# Patient Record
Sex: Female | Born: 1955 | Hispanic: No | Smoking: Never smoker
Health system: Southern US, Community
[De-identification: ages and names within clinical notes are randomized; demographics above are authoritative.]

## PROBLEM LIST (undated history)

## (undated) DIAGNOSIS — I10 Essential (primary) hypertension: Secondary | ICD-10-CM

---

## 2018-07-25 ENCOUNTER — Inpatient Hospital Stay (HOSPITAL_COMMUNITY)
Admission: AD | Admit: 2018-07-25 | Discharge: 2018-07-29 | DRG: 418 | Disposition: A | Discharge status: Home / Self Care | Payer: Medicare HMO | Source: Other Acute Inpatient Hospital | Attending: Internal Medicine | Admitting: Internal Medicine

## 2018-07-25 DIAGNOSIS — E785 Hyperlipidemia, unspecified: Secondary | ICD-10-CM | POA: Diagnosis present

## 2018-07-25 DIAGNOSIS — M069 Rheumatoid arthritis, unspecified: Secondary | ICD-10-CM | POA: Diagnosis present

## 2018-07-25 DIAGNOSIS — F32A Depression, unspecified: Secondary | ICD-10-CM | POA: Diagnosis present

## 2018-07-25 DIAGNOSIS — M3320 Polymyositis, organ involvement unspecified: Secondary | ICD-10-CM | POA: Diagnosis present

## 2018-07-25 DIAGNOSIS — I10 Essential (primary) hypertension: Secondary | ICD-10-CM | POA: Diagnosis present

## 2018-07-25 DIAGNOSIS — F419 Anxiety disorder, unspecified: Secondary | ICD-10-CM | POA: Diagnosis present

## 2018-07-25 DIAGNOSIS — K828 Other specified diseases of gallbladder: Secondary | ICD-10-CM | POA: Diagnosis present

## 2018-07-25 DIAGNOSIS — Z6835 Body mass index (BMI) 35.0-35.9, adult: Secondary | ICD-10-CM | POA: Diagnosis not present

## 2018-07-25 DIAGNOSIS — R1011 Right upper quadrant pain: Secondary | ICD-10-CM | POA: Diagnosis present

## 2018-07-25 DIAGNOSIS — E876 Hypokalemia: Secondary | ICD-10-CM | POA: Diagnosis present

## 2018-07-25 DIAGNOSIS — F418 Other specified anxiety disorders: Secondary | ICD-10-CM | POA: Diagnosis present

## 2018-07-25 DIAGNOSIS — K851 Biliary acute pancreatitis without necrosis or infection: Secondary | ICD-10-CM | POA: Diagnosis present

## 2018-07-25 DIAGNOSIS — K219 Gastro-esophageal reflux disease without esophagitis: Secondary | ICD-10-CM | POA: Diagnosis present

## 2018-07-25 DIAGNOSIS — K810 Acute cholecystitis: Secondary | ICD-10-CM | POA: Diagnosis present

## 2018-07-25 DIAGNOSIS — K8050 Calculus of bile duct without cholangitis or cholecystitis without obstruction: Secondary | ICD-10-CM

## 2018-07-25 DIAGNOSIS — F329 Major depressive disorder, single episode, unspecified: Secondary | ICD-10-CM | POA: Diagnosis present

## 2018-07-25 DIAGNOSIS — Z7952 Long term (current) use of systemic steroids: Secondary | ICD-10-CM

## 2018-07-25 DIAGNOSIS — K8062 Calculus of gallbladder and bile duct with acute cholecystitis without obstruction: Secondary | ICD-10-CM | POA: Diagnosis present

## 2018-07-25 HISTORY — DX: Essential (primary) hypertension: I10

## 2018-07-25 LAB — CREATININE, SERUM
Creatinine, Ser: 0.96 mg/dL (ref 0.44–1.00)
GFR calc non Af Amer: >60 mL/min (ref >60)

## 2018-07-25 LAB — MAGNESIUM: MAGNESIUM: 2.4 mg/dL (ref 1.7–2.4)

## 2018-07-25 MED ORDER — SODIUM CHLORIDE 0.9 % IV SOLN
1.0000 g | Freq: Three times a day (TID) | INTRAVENOUS | Status: DC
  Administered 2018-07-25 – 2018-07-29 (×10): 1 g via INTRAVENOUS
  Filled 2018-07-25 (×12): qty 1

## 2018-07-25 MED ORDER — LACTATED RINGERS IV SOLN
INTRAVENOUS | Status: DC
  Administered 2018-07-25 – 2018-07-27 (×7): via INTRAVENOUS
  Administered 2018-07-28: 1 mL via INTRAVENOUS
  Administered 2018-07-29: 07:00:00 via INTRAVENOUS

## 2018-07-25 MED ORDER — ENOXAPARIN SODIUM 40 MG/0.4ML ~~LOC~~ SOLN
40.0000 mg | SUBCUTANEOUS | Status: DC
  Administered 2018-07-25 – 2018-07-28 (×4): 40 mg via SUBCUTANEOUS
  Filled 2018-07-25 (×4): qty 0.4

## 2018-07-25 MED ORDER — ONDANSETRON HCL 4 MG PO TABS: 4.0000 mg | ORAL_TABLET | Freq: Four times a day (QID) | ORAL | Status: DC | PRN

## 2018-07-25 MED ORDER — ONDANSETRON HCL 4 MG/2ML IJ SOLN
4.0000 mg | Freq: Four times a day (QID) | INTRAMUSCULAR | Status: DC | PRN
  Administered 2018-07-26 – 2018-07-28 (×3): 4 mg via INTRAVENOUS
  Filled 2018-07-25 (×2): qty 2

## 2018-07-25 MED ORDER — HYDROMORPHONE HCL 1 MG/ML IJ SOLN
1.0000 mg | INTRAMUSCULAR | Status: DC | PRN
  Administered 2018-07-26 – 2018-07-28 (×3): 1 mg via INTRAVENOUS
  Filled 2018-07-25 (×3): qty 1

## 2018-07-25 NOTE — H&P (Signed)
History and Physical   Adriana Roberts JAS:[id number] DOB: 07/23/1956 DOA: 07/25/2018  Referring MD/NP/PA: Dr Nicoletta Ba   PCP: No primary care provider on file.   Outpatient Specialists: None   Patient coming from: Transferred from [facility]  Chief Complaint: Abdominal pain  HPI: Adriana Roberts is a 62 y.o. female with medical history significant of hypertension, rheumatoid arthritis, GERD, depression with anxiety who presented to the emergency room at [facility] rocking hand with right upper quadrant abdominal pain nausea and some episode of vomiting.  Patient has been having symptoms for about 3 days.  Denied any fever or chills no diarrhea no melena no hematemesis.  Patient was seen and evaluated and found to have right upper quadrant abdominal tenderness.  Also had abdominal ultrasound showing cholelithiasis with evidence of acute cholecystitis.  MRCP was done showing gallstones. No gallbladder wall thickening or pericholecystic fluid butCholedocholithiasis. Multiple small stones are identified with evidence of early cholecystitis.  LFTs were also elevated with alkaline phosphatase of 236, AST of 376, ALT of 268, total bilirubin of 1.7, amylase 1799, lipase 4499 and H. pylori serology positive.  Patient will require ERCP and there is no gastroenterologist that so transferred here so GI can see patient and do ERCP.  Dr. Evette Cristal has accepted the patient.  Review of Systems: As per HPI otherwise 10 point review of systems negative.    Past medical history:  Hypertension Polymyositis Morbid obesity Hyperlipidemia  Social history:   has no tobacco, alcohol, or IV drug use.  Allergies:  No known drug allergies  No family history on file.   Prior to Admission medications   Not on File    Physical Exam: Vitals:   07/25/18 1716  BP: 131/75  Pulse: 68  Resp: 16  Temp: 98.3 F (36.8 C)  TempSrc: Oral  SpO2: 98%      Constitutional: NAD, calm,  comfortable Vitals:   07/25/18 1716  BP: 131/75  Pulse: 68  Resp: 16  Temp: 98.3 F (36.8 C)  TempSrc: Oral  SpO2: 98%   Eyes: PERRL, lids and conjunctivae normal ENMT: Mucous membranes are moist. Posterior pharynx clear of any exudate or lesions.Normal dentition.  Neck: normal, supple, no masses, no thyromegaly Respiratory: clear to auscultation bilaterally, no wheezing, no crackles. Normal respiratory effort. No accessory muscle use.  Cardiovascular: Regular rate and rhythm, no murmurs / rubs / gallops. No extremity edema. 2+ pedal pulses. No carotid bruits.  Abdomen: RUQ tenderness, no masses palpated. No hepatosplenomegaly. Bowel sounds positive.  Musculoskeletal: no clubbing / cyanosis. No joint deformity upper and lower extremities. Good ROM, no contractures. Normal muscle tone.  Skin: no rashes, lesions, ulcers. No induration Neurologic: CN 2-12 grossly intact. Sensation intact, DTR normal. Strength 5/5 in all 4.  Psychiatric: Normal judgment and insight. Alert and oriented x 3. Normal mood.     Labs on Admission: I have personally reviewed following labs and imaging studies  CBC: No results for input(s): WBC, NEUTROABS, HGB, HCT, MCV, PLT in the last 168 hours. Basic Metabolic Panel: No results for input(s): NA, K, CL, CO2, GLUCOSE, BUN, CREATININE, CALCIUM, MG, PHOS in the last 168 hours. GFR: CrCl cannot be calculated (No successful lab value found.). Liver Function Tests: No results for input(s): AST, ALT, ALKPHOS, BILITOT, PROT, ALBUMIN in the last 168 hours. No results for input(s): LIPASE, AMYLASE in the last 168 hours. No results for input(s): AMMONIA in the last 168 hours. Coagulation Profile: No results for input(s): INR, PROTIME in the last  168 hours. Cardiac Enzymes: No results for input(s): CKTOTAL, CKMB, CKMBINDEX, TROPONINI in the last 168 hours. BNP (last 3 results) No results for input(s): PROBNP in the last 8760 hours. HbA1C: No results for  input(s): HGBA1C in the last 72 hours. CBG: No results for input(s): GLUCAP in the last 168 hours. Lipid Profile: No results for input(s): CHOL, HDL, LDLCALC, TRIG, CHOLHDL, LDLDIRECT in the last 72 hours. Thyroid Function Tests: No results for input(s): TSH, T4TOTAL, FREET4, T3FREE, THYROIDAB in the last 72 hours. Anemia Panel: No results for input(s): VITAMINB12, FOLATE, FERRITIN, TIBC, IRON, RETICCTPCT in the last 72 hours. Urine analysis: No results found for: COLORURINE, APPEARANCEUR, LABSPEC, PHURINE, GLUCOSEU, HGBUR, BILIRUBINUR, KETONESUR, PROTEINUR, UROBILINOGEN, NITRITE, LEUKOCYTESUR Sepsis Labs: @LABRCNTIP (procalcitonin:4,lacticidven:4) )No results found for this or any previous visit (from the past 240 hour(s)).   Radiological Exams on Admission: No results found.   Assessment/Plan Principal Problem:   Acute gallstone pancreatitis Active Problems:   Cholecystitis, acute   Benign essential HTN   Depression     #1 acute gallstone pancreatitis: Patient will be admitted and monitored.  Serial lipase checks.  Bowel rest.  Pain management with nausea management.  IV fluids using Ringer's lactate.  GI consulted and will follow up with the patient.  Patient will also need ERCP and ultimately laparoscopic cholecystectomy.  #2 acute cholecystitis: Patient will definitely need some surgical intervention once the pancreatitis is resolved.  Continue bowel rest with IV antibiotics  #3 hypertension: Continue blood pressure control with home regimen.  #4 polymyositis: Patient is on methotrexate and other medications from home.  Once med rec is completed patient will be initiated on home regimen.  #5 depression with anxiety: patient on Wellbutrin from home which will be resumed as soon as possible.  #6 morbid obesity: Dietary counseling.   DVT prophylaxis: Lovenox Code Status: Full code Family Communication: Sister with the patient Disposition Plan: Home Consults called: Dr.  , gastroenterology Admission status: inpatient   Severity of Illness: The appropriate patient status for this patient is INPATIENT. Inpatient status is judged to be reasonable and necessary in order to provide the required intensity of service to ensure the patient's safety. The patient's presenting symptoms, physical exam findings, and initial radiographic and laboratory data in the context of their chronic comorbidities is felt to place them at high risk for further clinical deterioration. Furthermore, it is not anticipated that the patient will be medically stable for discharge from the hospital within 2 midnights of admission. The following factors support the patient status of inpatient.   " The patient's presenting symptoms include abdominal pain. " The worrisome physical exam findings include right upper quadrant abdominal tenderness. " The initial radiographic and laboratory data are worrisome because of elevated amylase lipase and liver function test with ultrasounds and MRCP findings. " The chronic co-morbidities include hypertension and polymyositis.   * I certify that at the point of admission it is my clinical judgment that the patient will require inpatient hospital care spanning beyond 2 midnights from the point of admission due to high intensity of service, high risk for further deterioration and high frequency of surveillance required.Herbert Moors MD [facility] Pager [phone]  If 7PM-7AM, please contact night-coverage [email or web address] Password [facility]  07/25/2018, 6:24 PM

## 2018-07-25 NOTE — Progress Notes (Signed)
Pharmacy Antibiotic Note  Adriana Roberts is a 62 y.o. female admitted on 07/25/2018 with an intraabdominal infection .  Pharmacy has been consulted for meropenem dosing.  Plan: Meropenem 1gm IV q8h Monitor for deescalation, LOT, and clinical course    Temp (24hrs), Avg:98.5 F (36.9 C), Min:98.3 F (36.8 C), Max:98.6 F (37 C)  Recent Labs  Lab 07/25/18 1839  CREATININE 0.96    CrCl cannot be calculated (Unknown ideal weight.).    No Known Allergies   Adriana Roberts A. Jeanella Craze, PharmD, BCPS Clinical Pharmacist [place] Pager: [phone] Please utilize Amion for appropriate phone number to reach the unit pharmacist [facility])    07/25/2018 10:20 PM

## 2018-07-25 NOTE — Progress Notes (Addendum)
Received patient to 6N. Patient alert and oriented x4. Pt independently ambulated from stretcher to bed. Dr. Nelson Chimes paged upon patient's arrival to 6N. Awaiting orders.   [phone] Paged [facility] for patient orders.

## 2018-07-26 DIAGNOSIS — K810 Acute cholecystitis: Secondary | ICD-10-CM

## 2018-07-26 DIAGNOSIS — K851 Biliary acute pancreatitis without necrosis or infection: Principal | ICD-10-CM

## 2018-07-26 DIAGNOSIS — M3320 Polymyositis, organ involvement unspecified: Secondary | ICD-10-CM

## 2018-07-26 DIAGNOSIS — I10 Essential (primary) hypertension: Secondary | ICD-10-CM

## 2018-07-26 DIAGNOSIS — F329 Major depressive disorder, single episode, unspecified: Secondary | ICD-10-CM

## 2018-07-26 LAB — CBC
HCT: 38.1 % (ref 36.0–46.0)
Hemoglobin: 11.6 g/dL — ABNORMAL LOW (ref 12.0–15.0)
MCH: 26.6 pg (ref 26.0–34.0)
MCHC: 30.4 g/dL (ref 30.0–36.0)
MCV: 87.4 fL (ref 78.0–100.0)
PLATELETS: 293 10*3/uL (ref 150–400)
RBC: 4.36 MIL/uL (ref 3.87–5.11)
RDW: 16.6 % — AB (ref 11.5–15.5)
WBC: 6.2 10*3/uL (ref 4.0–10.5)

## 2018-07-26 LAB — COMPREHENSIVE METABOLIC PANEL
ALT: 201 U/L — AB (ref 0–44)
AST: 180 U/L — AB (ref 15–41)
Albumin: 3.2 g/dL — ABNORMAL LOW (ref 3.5–5.0)
Alkaline Phosphatase: 191 U/L — ABNORMAL HIGH (ref 38–126)
Anion gap: 11 (ref 5–15)
BUN: 15 mg/dL (ref 6–20)
CHLORIDE: 110 mmol/L (ref 98–111)
CO2: 25 mmol/L (ref 22–32)
CREATININE: 0.88 mg/dL (ref 0.44–1.00)
Calcium: 8.8 mg/dL — ABNORMAL LOW (ref 8.9–10.3)
Glucose, Bld: 66 mg/dL — ABNORMAL LOW (ref 70–99)
POTASSIUM: 3.1 mmol/L — AB (ref 3.5–5.1)
Sodium: 146 mmol/L — ABNORMAL HIGH (ref 135–145)
TOTAL PROTEIN: 6.7 g/dL (ref 6.5–8.1)
Total Bilirubin: 1.8 mg/dL — ABNORMAL HIGH (ref 0.3–1.2)

## 2018-07-26 LAB — HIV ANTIBODY (ROUTINE TESTING W REFLEX): HIV SCREEN 4TH GENERATION: NONREACTIVE

## 2018-07-26 MED ORDER — FAMOTIDINE IN NACL 20-0.9 MG/50ML-% IV SOLN
20.0000 mg | INTRAVENOUS | Status: DC
  Administered 2018-07-26 – 2018-07-28 (×3): 20 mg via INTRAVENOUS
  Filled 2018-07-26 (×3): qty 50

## 2018-07-26 MED ORDER — POTASSIUM CHLORIDE 10 MEQ/100ML IV SOLN
10.0000 meq | INTRAVENOUS | Status: AC
  Administered 2018-07-26 (×4): 10 meq via INTRAVENOUS
  Filled 2018-07-26 (×2): qty 100

## 2018-07-26 NOTE — H&P (View-Only) (Signed)
[facility] Consult  Referring Provider: Rometta Emery, MD Primary Care Physician:  No primary care provider on file. Primary Gastroenterologist: Gentry Fitz  Reason for Consultation:  CBD stones, pancreatitis  HPI: Adriana Roberts is a 62 y.o. female was transferred from [facility] she had an MRCP which showed at least 9 CBD stones measuring up to 5 mm, distantly dilated CBD of 1 cm, elevated lipase and amylase compatible with pancreatitis and multiple gallstones. Patient has been symptomatic since August with several episodes of epigastric and upper abdominal pain associated with nausea and bloating and radiation of pain to the back. Lab work from [facility] also showed that she is positive for H. Pylori. Patient denies recent unintentional weight loss, acid reflux or difficulty swallowing.   Past medical history: Hypertension Polymyositis Rheumatoid arthritis Depression Anxiety Obesity   Prior to Admission medications   Not on File  Prednisone methotrexate  Current Facility-Administered Medications  Medication Dose Route Frequency Provider Last Rate Last Dose  . enoxaparin (LOVENOX) injection 40 mg  40 mg Subcutaneous Q24H Earlie Lou L, MD   40 mg at 07/25/18 1913  . HYDROmorphone (DILAUDID) injection 1 mg  1 mg Intravenous Q3H PRN Rometta Emery, MD   1 mg at 07/26/18 0747  . lactated ringers infusion   Intravenous Continuous Rometta Emery, MD 125 mL/hr at 07/26/18 0500    . meropenem (MERREM) 1 g in sodium chloride 0.9 % 100 mL IVPB  1 g Intravenous Q8H Pierce, Dwayne A, RPH 200 mL/hr at 07/26/18 0509 1 g at 07/26/18 0509  . ondansetron (ZOFRAN) tablet 4 mg  4 mg Oral Q6H PRN Rometta Emery, MD       Or  . ondansetron (ZOFRAN) injection 4 mg  4 mg Intravenous Q6H PRN Rometta Emery, MD        Allergies as of 07/25/2018  . (No Known Allergies)    No family history on file.  Social History   Socioeconomic History  . Marital  status: Unknown    Spouse name: Not on file  . Number of children: Not on file  . Years of education: Not on file  . Highest education level: Not on file  Occupational History  . Not on file  Social Needs  . Financial resource strain: Not on file  . Food insecurity:    Worry: Not on file    Inability: Not on file  . Transportation needs:    Medical: Not on file    Non-medical: Not on file  Tobacco Use  . Smoking status: Not on file  Substance and Sexual Activity  . Alcohol use: Not on file  . Drug use: Not on file  . Sexual activity: Not on file  Lifestyle  . Physical activity:    Days per week: Not on file    Minutes per session: Not on file  . Stress: Not on file  Relationships  . Social connections:    Talks on phone: Not on file    Gets together: Not on file    Attends religious service: Not on file    Active member of club or organization: Not on file    Attends meetings of clubs or organizations: Not on file    Relationship status: Not on file  . Intimate partner violence:    Fear of current or ex partner: Not on file    Emotionally abused: Not on file    Physically abused: Not on file    Forced  sexual activity: Not on file  Other Topics Concern  . Not on file  Social History Narrative  . Not on file    Review of Systems:  GI: Described in detail in HPI.    Gen: Denies any fever, chills, rigors, night sweats, anorexia, fatigue, weakness, malaise, involuntary weight loss, and sleep disorder CV: Denies chest pain, angina, palpitations, syncope, orthopnea, PND, peripheral edema, and claudication. Resp: Denies dyspnea, cough, sputum, wheezing, coughing up blood. GU : Denies urinary burning, blood in urine, urinary frequency, urinary hesitancy, nocturnal urination, and urinary incontinence. MS: Denies joint pain or swelling.  Denies muscle weakness, cramps, atrophy.  Derm: Denies rash, itching, oral ulcerations, hives, unhealing ulcers.  Psych: Denies depression,  anxiety, memory loss, suicidal ideation, hallucinations,  and confusion. Heme: Denies bruising, bleeding, and enlarged lymph nodes. Neuro:  Denies any headaches, dizziness, paresthesias. Endo:  Denies any problems with DM, thyroid, adrenal function.  Physical Exam: Vital signs in last 24 hours: Temp:  [98 F (36.7 C)-98.6 F (37 C)] 98 F (36.7 C) (09/28 0508) Pulse Rate:  [65-75] 75 (09/28 0508) Resp:  [16-20] 20 (09/28 0508) BP: (102-131)/(66-75) 125/71 (09/28 0508) SpO2:  [98 %-100 %] 100 % (09/28 0508) Last BM Date: 07/24/18  General:   Alert,  Well-developed, obese, pleasant and cooperative in NAD Head:  Normocephalic and atraumatic. Eyes:  Sclera clear, no icterus.   Conjunctiva pink. Ears:  Normal auditory acuity. Nose:  No deformity, discharge,  or lesions. Mouth:  No deformity or lesions.  Oropharynx pink & moist. Neck:  Supple; no masses or thyromegaly. Lungs:  Clear throughout to auscultation.   No wheezes, crackles, or rhonchi. No acute distress. Heart:  Regular rate and rhythm; no murmurs, clicks, rubs,  or gallops. Extremities:  Without clubbing or edema. Neurologic:  Alert and  oriented x4;  grossly normal neurologically. Skin:  Intact without significant lesions or rashes. Psych:  Alert and cooperative. Normal mood and affect. Abdomen:  Soft, Upper abdominal tenderness and nondistended. No masses, hepatosplenomegaly or hernias noted. Normal bowel sounds, without guarding, and without rebound.         Lab Results: Recent Labs    07/26/18 0545  WBC 6.2  HGB 11.6*  HCT 38.1  PLT 293   BMET Recent Labs    07/25/18 1839 07/26/18 0545  NA  --  146*  K  --  3.1*  CL  --  110  CO2  --  25  GLUCOSE  --  66*  BUN  --  15  CREATININE 0.96 0.88  CALCIUM  --  8.8*   LFT Recent Labs    07/26/18 0545  PROT 6.7  ALBUMIN 3.2*  AST 180*  ALT 201*  ALKPHOS 191*  BILITOT 1.8*   PT/INR No results for input(s): LABPROT, INR in the last 72  hours.  Studies/Results: No results found.   Labs from [facility] show elevated lipase of 4000. Elevated liver enzymes compatible with choledocholithiasis  Impression: 1. Gallstone pancreatitis-T bili 1.8/AST 180/AST 201/ALP 191 2. Choledocholithiasis   Plan: Clear liquid diet, nothing by mouth post midnight, ERCP in a.m. depending on anesthesia availability. Recommend Surgical consultation for evaluation for cholecystectomy. Continuing Ringer's lactate at 125 mL per hour. The risks and benefits of the procedure were discussed with the patient in details. She understands and verbalizes consent.   LOS: 1 day   Rahma Meller, M.D.  07/26/2018, 8:44 AM  Pager [phone] If no answer or after 5 PM call [phone] 

## 2018-07-26 NOTE — Progress Notes (Addendum)
PROGRESS NOTE    Adriana Roberts  WEX:[id number] DOB: [birth year] DOA: 07/25/2018 PCP: No primary care provider on file.    Brief Narrative:  62 year old female who presented with abdominal pain.  She does have significant past medical history for hypertension, rheumatoid arthritis, GERD, depression and anxiety.  Reported 3-day history of right upper quadrant abdominal pain, associated with nausea and vomiting.  Patient was evaluated at [facility], and diagnosed with acute cholecystitis.  Her liver enzymes were elevated, alkaline phosphatase 236, AST 376, ALT 268, amylase 1799, lipase 4499.  Patient was transferred for ERCP. on her initial physical examination blood pressure 131/75, heart rate 68, respiratory rate 16, temperature 98.3, oxygen saturation 98%.  Lungs were clear to auscultation bilaterally, heart S1-S2 present and rhythmic, abdomen tender at the right upper quadrant, no lower extremity edema.  Sodium 146, potassium 3.1, chloride 110, bicarb 25, glucose 66, BUN 15, creatinine 0.88, alkaline phosphatase 191, AST 180, ALT 201, total bilirubin is 1.8, white count 6.2, hemoglobin 11.6, hematocrit 38.1, platelets 293.  Patient was admitted to the hospital with working diagnosis of acute cholecystitis/ choledocholithiasis complicated by gallstone pancreatitis..   Assessment & Plan:   Principal Problem:   Acute gallstone pancreatitis Active Problems:   Cholecystitis, acute   Benign essential HTN   Depression   Polymyositis (HCC)   1.  Acute cholecystitis, complicated by choledocholithiasis and gallstone pancreatitis. Continue supportive medical therapy with IV fluids (balanced electrolyte solutions), IV antiacids, as needed IV analgesics and IV antiemetics. Prophylactic antibiotic with IV meropenem. Will follow with GI recommendations for ERCP in am, patient will need cholecystectomy on this admission. Follow LFTs in am.   2.  Hypertension. Blood pressure  systolic has remained stable 125 to 135 mmHg. Will continue close monitoring, patient is npo. At home treated with carvedilol, amlodipine, hctz.   3. Rheumatoid arthritis. Patient on methotrexate and prednisone 5 mg daily. Will have a close follow up of blood pressure and electrolytes, if signs of adrenal insufficiency will add stress dose steroids.    4. Hypokalemia. K correction with Kcl, 40 meq IV, will continue lactate ringers at 100 ml per hour (follow a restrictive IV fluids strategy). Follow on renal function and electrolytes in am. Today serum cr at 0,88 with serum bicarbonate at 25.    DVT prophylaxis: enoxaparin   Code Status:  full Family Communication: no family at the bedside  Disposition Plan/ discharge barriers: pending GI and surgical interventions   Consultants:    GI   Surgery   Procedures:     Antimicrobials:   Meropenem IV     Subjective: Patient continue to have abdominal pain with nausea, moderate in intensity, no chest pain or dyspnea.   Objective: Vitals:   07/25/18 1716 07/25/18 2024 07/26/18 0508  BP: 131/75 102/66 125/71  Pulse: 68 65 75  Resp: 16 20 20   Temp: 98.3 F (36.8 C) 98.6 F (37 C) 98 F (36.7 C)  TempSrc: Oral Oral Oral  SpO2: 98% 100% 100%    Intake/Output Summary (Last 24 hours) at 07/26/2018 1226 Last data filed at 07/26/2018 1011 Gross per 24 hour  Intake 1600.14 ml  Output -  Net 1600.14 ml   There were no vitals filed for this visit.  Examination:   General: deconditioned  Neurology: Awake and alert, non focal  E ENT: mild pallor, no icterus, oral mucosa moist Cardiovascular: No JVD. S1-S2 present, rhythmic, no gallops, rubs, or murmurs. No lower extremity edema. Pulmonary: positive breath sounds  bilaterally, adequate air movement, no wheezing, rhonchi or rales. Gastrointestinal. Abdomen distended, tender to deep palpation, no organomegaly, no rebound or guarding Skin. No rashes Musculoskeletal: no joint  deformities     Data Reviewed: I have personally reviewed following labs and imaging studies  CBC: Recent Labs  Lab 07/26/18 0545  WBC 6.2  HGB 11.6*  HCT 38.1  MCV 87.4  PLT 293   Basic Metabolic Panel: Recent Labs  Lab 07/25/18 1839 07/26/18 0545  NA  --  146*  K  --  3.1*  CL  --  110  CO2  --  25  GLUCOSE  --  66*  BUN  --  15  CREATININE 0.96 0.88  CALCIUM  --  8.8*  MG 2.4  --    GFR: CrCl cannot be calculated (Unknown ideal weight.). Liver Function Tests: Recent Labs  Lab 07/26/18 0545  AST 180*  ALT 201*  ALKPHOS 191*  BILITOT 1.8*  PROT 6.7  ALBUMIN 3.2*   No results for input(s): LIPASE, AMYLASE in the last 168 hours. No results for input(s): AMMONIA in the last 168 hours. Coagulation Profile: No results for input(s): INR, PROTIME in the last 168 hours. Cardiac Enzymes: No results for input(s): CKTOTAL, CKMB, CKMBINDEX, TROPONINI in the last 168 hours. BNP (last 3 results) No results for input(s): PROBNP in the last 8760 hours. HbA1C: No results for input(s): HGBA1C in the last 72 hours. CBG: No results for input(s): GLUCAP in the last 168 hours. Lipid Profile: No results for input(s): CHOL, HDL, LDLCALC, TRIG, CHOLHDL, LDLDIRECT in the last 72 hours. Thyroid Function Tests: No results for input(s): TSH, T4TOTAL, FREET4, T3FREE, THYROIDAB in the last 72 hours. Anemia Panel: No results for input(s): VITAMINB12, FOLATE, FERRITIN, TIBC, IRON, RETICCTPCT in the last 72 hours.    Radiology Studies: I have reviewed all of the imaging during this hospital visit personally     Scheduled Meds: . enoxaparin (LOVENOX) injection  40 mg Subcutaneous Q24H   Continuous Infusions: . lactated ringers 125 mL/hr at 07/26/18 1027  . meropenem (MERREM) IV 1 g (07/26/18 0509)     LOS: 1 day        Consetta Cosner Annett Gula, MD [facility] Pager [phone]

## 2018-07-26 NOTE — Consult Note (Addendum)
[facility] Consult  Referring Provider: Rometta Emery, MD Primary Care Physician:  No primary care provider on file. Primary Gastroenterologist: Gentry Fitz  Reason for Consultation:  CBD stones, pancreatitis  HPI: Adriana Roberts is a 62 y.o. female was transferred from [facility] she had an MRCP which showed at least 9 CBD stones measuring up to 5 mm, distantly dilated CBD of 1 cm, elevated lipase and amylase compatible with pancreatitis and multiple gallstones. Patient has been symptomatic since August with several episodes of epigastric and upper abdominal pain associated with nausea and bloating and radiation of pain to the back. Lab work from [facility] also showed that she is positive for H. Pylori. Patient denies recent unintentional weight loss, acid reflux or difficulty swallowing.   Past medical history: Hypertension Polymyositis Rheumatoid arthritis Depression Anxiety Obesity   Prior to Admission medications   Not on File  Prednisone methotrexate  Current Facility-Administered Medications  Medication Dose Route Frequency Provider Last Rate Last Dose  . enoxaparin (LOVENOX) injection 40 mg  40 mg Subcutaneous Q24H Earlie Lou L, MD   40 mg at 07/25/18 1913  . HYDROmorphone (DILAUDID) injection 1 mg  1 mg Intravenous Q3H PRN Rometta Emery, MD   1 mg at 07/26/18 0747  . lactated ringers infusion   Intravenous Continuous Rometta Emery, MD 125 mL/hr at 07/26/18 0500    . meropenem (MERREM) 1 g in sodium chloride 0.9 % 100 mL IVPB  1 g Intravenous Q8H Pierce, Dwayne A, RPH 200 mL/hr at 07/26/18 0509 1 g at 07/26/18 0509  . ondansetron (ZOFRAN) tablet 4 mg  4 mg Oral Q6H PRN Rometta Emery, MD       Or  . ondansetron (ZOFRAN) injection 4 mg  4 mg Intravenous Q6H PRN Rometta Emery, MD        Allergies as of 07/25/2018  . (No Known Allergies)    No family history on file.  Social History   Socioeconomic History  . Marital  status: Unknown    Spouse name: Not on file  . Number of children: Not on file  . Years of education: Not on file  . Highest education level: Not on file  Occupational History  . Not on file  Social Needs  . Financial resource strain: Not on file  . Food insecurity:    Worry: Not on file    Inability: Not on file  . Transportation needs:    Medical: Not on file    Non-medical: Not on file  Tobacco Use  . Smoking status: Not on file  Substance and Sexual Activity  . Alcohol use: Not on file  . Drug use: Not on file  . Sexual activity: Not on file  Lifestyle  . Physical activity:    Days per week: Not on file    Minutes per session: Not on file  . Stress: Not on file  Relationships  . Social connections:    Talks on phone: Not on file    Gets together: Not on file    Attends religious service: Not on file    Active member of club or organization: Not on file    Attends meetings of clubs or organizations: Not on file    Relationship status: Not on file  . Intimate partner violence:    Fear of current or ex partner: Not on file    Emotionally abused: Not on file    Physically abused: Not on file    Forced  sexual activity: Not on file  Other Topics Concern  . Not on file  Social History Narrative  . Not on file    Review of Systems:  GI: Described in detail in HPI.    Gen: Denies any fever, chills, rigors, night sweats, anorexia, fatigue, weakness, malaise, involuntary weight loss, and sleep disorder CV: Denies chest pain, angina, palpitations, syncope, orthopnea, PND, peripheral edema, and claudication. Resp: Denies dyspnea, cough, sputum, wheezing, coughing up blood. GU : Denies urinary burning, blood in urine, urinary frequency, urinary hesitancy, nocturnal urination, and urinary incontinence. MS: Denies joint pain or swelling.  Denies muscle weakness, cramps, atrophy.  Derm: Denies rash, itching, oral ulcerations, hives, unhealing ulcers.  Psych: Denies depression,  anxiety, memory loss, suicidal ideation, hallucinations,  and confusion. Heme: Denies bruising, bleeding, and enlarged lymph nodes. Neuro:  Denies any headaches, dizziness, paresthesias. Endo:  Denies any problems with DM, thyroid, adrenal function.  Physical Exam: Vital signs in last 24 hours: Temp:  [98 F (36.7 C)-98.6 F (37 C)] 98 F (36.7 C) (09/28 0508) Pulse Rate:  [65-75] 75 (09/28 0508) Resp:  [16-20] 20 (09/28 0508) BP: (102-131)/(66-75) 125/71 (09/28 0508) SpO2:  [98 %-100 %] 100 % (09/28 0508) Last BM Date: 07/24/18  General:   Alert,  Well-developed, obese, pleasant and cooperative in NAD Head:  Normocephalic and atraumatic. Eyes:  Sclera clear, no icterus.   Conjunctiva pink. Ears:  Normal auditory acuity. Nose:  No deformity, discharge,  or lesions. Mouth:  No deformity or lesions.  Oropharynx pink & moist. Neck:  Supple; no masses or thyromegaly. Lungs:  Clear throughout to auscultation.   No wheezes, crackles, or rhonchi. No acute distress. Heart:  Regular rate and rhythm; no murmurs, clicks, rubs,  or gallops. Extremities:  Without clubbing or edema. Neurologic:  Alert and  oriented x4;  grossly normal neurologically. Skin:  Intact without significant lesions or rashes. Psych:  Alert and cooperative. Normal mood and affect. Abdomen:  Soft, Upper abdominal tenderness and nondistended. No masses, hepatosplenomegaly or hernias noted. Normal bowel sounds, without guarding, and without rebound.         Lab Results: Recent Labs    07/26/18 0545  WBC 6.2  HGB 11.6*  HCT 38.1  PLT 293   BMET Recent Labs    07/25/18 1839 07/26/18 0545  NA  --  146*  K  --  3.1*  CL  --  110  CO2  --  25  GLUCOSE  --  66*  BUN  --  15  CREATININE 0.96 0.88  CALCIUM  --  8.8*   LFT Recent Labs    07/26/18 0545  PROT 6.7  ALBUMIN 3.2*  AST 180*  ALT 201*  ALKPHOS 191*  BILITOT 1.8*   PT/INR No results for input(s): LABPROT, INR in the last 72  hours.  Studies/Results: No results found.   Labs from [facility] show elevated lipase of 4000. Elevated liver enzymes compatible with choledocholithiasis  Impression: 1. Gallstone pancreatitis-T bili 1.8/AST 180/AST 201/ALP 191 2. Choledocholithiasis   Plan: Clear liquid diet, nothing by mouth post midnight, ERCP in a.m. depending on anesthesia availability. Recommend Surgical consultation for evaluation for cholecystectomy. Continuing Ringer's lactate at 125 mL per hour. The risks and benefits of the procedure were discussed with the patient in details. She understands and verbalizes consent.   LOS: 1 day   Kerin Salen, M.D.  07/26/2018, 8:44 AM  Pager [phone] If no answer or after 5 PM call [phone]

## 2018-07-26 NOTE — Anesthesia Preprocedure Evaluation (Addendum)
Anesthesia Evaluation  Patient identified by MRN, date of birth, ID band Patient awake    Reviewed: Allergy & Precautions, NPO status , Patient's Chart, lab work & pertinent test results  History of Anesthesia Complications Negative for: history of anesthetic complications  Airway Mallampati: II  TM Distance: >3 FB Neck ROM: Full    Dental no notable dental hx. (+) Dental Advisory Given   Pulmonary neg pulmonary ROS,    Pulmonary exam normal        Cardiovascular hypertension, Pt. on home beta blockers and Pt. on medications Normal cardiovascular exam     Neuro/Psych PSYCHIATRIC DISORDERS Depression    GI/Hepatic Neg liver ROS,   Endo/Other  Morbid obesity  Renal/GU negative Renal ROS     Musculoskeletal negative musculoskeletal ROS (+)   Abdominal   Peds  Hematology negative hematology ROS (+)   Anesthesia Other Findings Day of surgery medications reviewed with the patient.  Reproductive/Obstetrics                            Anesthesia Physical Anesthesia Plan  ASA: III  Anesthesia Plan: General   Post-op Pain Management:    Induction:   PONV Risk Score and Plan: 3 and Ondansetron, Dexamethasone and Scopolamine patch - Pre-op  Airway Management Planned: Oral ETT  Additional Equipment:   Intra-op Plan:   Post-operative Plan: Extubation in OR  Informed Consent: I have reviewed the patients History and Physical, chart, labs and discussed the procedure including the risks, benefits and alternatives for the proposed anesthesia with the patient or authorized representative who has indicated his/her understanding and acceptance.   Dental advisory given  Plan Discussed with: CRNA and Anesthesiologist  Anesthesia Plan Comments:        Anesthesia Quick Evaluation

## 2018-07-27 ENCOUNTER — Encounter (HOSPITAL_COMMUNITY)
Admission: AD | Disposition: A | Discharge status: Home / Self Care | Payer: Self-pay | Source: Other Acute Inpatient Hospital | Attending: Internal Medicine

## 2018-07-27 ENCOUNTER — Inpatient Hospital Stay (HOSPITAL_COMMUNITY): Payer: Medicare HMO | Admitting: Anesthesiology

## 2018-07-27 ENCOUNTER — Inpatient Hospital Stay (HOSPITAL_COMMUNITY): Payer: Medicare HMO

## 2018-07-27 ENCOUNTER — Encounter (HOSPITAL_COMMUNITY): Payer: Self-pay | Admitting: Certified Registered Nurse Anesthetist

## 2018-07-27 HISTORY — PX: SPHINCTEROTOMY: SHX5544

## 2018-07-27 HISTORY — PX: ERCP: SHX5425

## 2018-07-27 HISTORY — PX: REMOVAL OF STONES: SHX5545

## 2018-07-27 LAB — CBC WITH DIFFERENTIAL/PLATELET
Abs Immature Granulocytes: 0 10*3/uL (ref 0.0–0.1)
BASOS PCT: 0 %
Basophils Absolute: 0 10*3/uL (ref 0.0–0.1)
EOS ABS: 0 10*3/uL (ref 0.0–0.7)
EOS PCT: 0 %
HCT: 39.9 % (ref 36.0–46.0)
HEMOGLOBIN: 12.3 g/dL (ref 12.0–15.0)
Immature Granulocytes: 0 %
Lymphocytes Relative: 7 %
Lymphs Abs: 0.8 10*3/uL (ref 0.7–4.0)
MCH: 26.7 pg (ref 26.0–34.0)
MCHC: 30.8 g/dL (ref 30.0–36.0)
MCV: 86.7 fL (ref 78.0–100.0)
Monocytes Absolute: 0.1 10*3/uL (ref 0.1–1.0)
Monocytes Relative: 1 %
NEUTROS PCT: 92 %
Neutro Abs: 9.8 10*3/uL — ABNORMAL HIGH (ref 1.7–7.7)
Platelets: 292 10*3/uL (ref 150–400)
RBC: 4.6 MIL/uL (ref 3.87–5.11)
RDW: 16.1 % — AB (ref 11.5–15.5)
WBC: 10.6 10*3/uL — AB (ref 4.0–10.5)

## 2018-07-27 LAB — COMPREHENSIVE METABOLIC PANEL
ALT: 133 U/L — AB (ref 0–44)
AST: 68 U/L — AB (ref 15–41)
Albumin: 3.3 g/dL — ABNORMAL LOW (ref 3.5–5.0)
Alkaline Phosphatase: 184 U/L — ABNORMAL HIGH (ref 38–126)
Anion gap: 11 (ref 5–15)
BILIRUBIN TOTAL: 0.9 mg/dL (ref 0.3–1.2)
BUN: 5 mg/dL — AB (ref 6–20)
CALCIUM: 8.9 mg/dL (ref 8.9–10.3)
CO2: 26 mmol/L (ref 22–32)
CREATININE: 0.74 mg/dL (ref 0.44–1.00)
Chloride: 103 mmol/L (ref 98–111)
Glucose, Bld: 109 mg/dL — ABNORMAL HIGH (ref 70–99)
Potassium: 3.3 mmol/L — ABNORMAL LOW (ref 3.5–5.1)
Sodium: 140 mmol/L (ref 135–145)
TOTAL PROTEIN: 7 g/dL (ref 6.5–8.1)

## 2018-07-27 SURGERY — ERCP, WITH INTERVENTION IF INDICATED
Anesthesia: General

## 2018-07-27 MED ORDER — MIDAZOLAM HCL 5 MG/5ML IJ SOLN
INTRAMUSCULAR | Status: DC | PRN
  Administered 2018-07-27: 2 mg via INTRAVENOUS

## 2018-07-27 MED ORDER — ONDANSETRON HCL 4 MG/2ML IJ SOLN
INTRAMUSCULAR | Status: DC | PRN
  Administered 2018-07-27: 4 mg via INTRAVENOUS

## 2018-07-27 MED ORDER — LACTATED RINGERS IV SOLN
INTRAVENOUS | Status: DC
  Administered 2018-07-27: 1000 mL via INTRAVENOUS

## 2018-07-27 MED ORDER — PROMETHAZINE HCL 25 MG/ML IJ SOLN: 6.2500 mg | INTRAMUSCULAR | Status: DC | PRN

## 2018-07-27 MED ORDER — IOPAMIDOL (ISOVUE-300) INJECTION 61%
INTRAVENOUS | Status: AC
  Filled 2018-07-27: qty 50

## 2018-07-27 MED ORDER — INDOMETHACIN 50 MG RE SUPP
RECTAL | Status: AC
  Filled 2018-07-27: qty 2

## 2018-07-27 MED ORDER — FENTANYL CITRATE (PF) 100 MCG/2ML IJ SOLN
INTRAMUSCULAR | Status: AC
  Filled 2018-07-27: qty 2

## 2018-07-27 MED ORDER — DEXAMETHASONE SODIUM PHOSPHATE 10 MG/ML IJ SOLN
INTRAMUSCULAR | Status: DC | PRN
  Administered 2018-07-27: 10 mg via INTRAVENOUS

## 2018-07-27 MED ORDER — VITAMIN D (ERGOCALCIFEROL) 1.25 MG (50000 UNIT) PO CAPS
50000.0000 [IU] | ORAL_CAPSULE | ORAL | Status: DC
  Administered 2018-07-27: 50000 [IU] via ORAL
  Filled 2018-07-27: qty 1

## 2018-07-27 MED ORDER — POTASSIUM CHLORIDE CRYS ER 20 MEQ PO TBCR
40.0000 meq | EXTENDED_RELEASE_TABLET | ORAL | Status: AC
  Administered 2018-07-27 (×2): 40 meq via ORAL
  Filled 2018-07-27 (×2): qty 2

## 2018-07-27 MED ORDER — INDOMETHACIN 50 MG RE SUPP
RECTAL | Status: DC | PRN
  Administered 2018-07-27: 100 mg via RECTAL

## 2018-07-27 MED ORDER — ROCURONIUM BROMIDE 50 MG/5ML IV SOSY
PREFILLED_SYRINGE | INTRAVENOUS | Status: DC | PRN
  Administered 2018-07-27: 50 mg via INTRAVENOUS

## 2018-07-27 MED ORDER — SODIUM CHLORIDE 0.9 % IV SOLN
INTRAVENOUS | Status: DC | PRN
  Administered 2018-07-27: 30 mL

## 2018-07-27 MED ORDER — SCOPOLAMINE 1 MG/3DAYS TD PT72
MEDICATED_PATCH | TRANSDERMAL | Status: AC
  Filled 2018-07-27: qty 1

## 2018-07-27 MED ORDER — GLUCAGON HCL RDNA (DIAGNOSTIC) 1 MG IJ SOLR
INTRAMUSCULAR | Status: DC | PRN
  Administered 2018-07-27: .5 mg via INTRAVENOUS

## 2018-07-27 MED ORDER — HYDROMORPHONE HCL 1 MG/ML IJ SOLN: 0.2500 mg | INTRAMUSCULAR | Status: DC | PRN

## 2018-07-27 MED ORDER — METHOTREXATE 2.5 MG PO TABS
17.5000 mg | ORAL_TABLET | ORAL | Status: DC
  Administered 2018-07-27: 17.5 mg via ORAL
  Filled 2018-07-27 (×2): qty 7

## 2018-07-27 MED ORDER — PANTOPRAZOLE SODIUM 40 MG PO TBEC
40.0000 mg | DELAYED_RELEASE_TABLET | Freq: Every day | ORAL | Status: DC
  Administered 2018-07-27 – 2018-07-29 (×2): 40 mg via ORAL
  Filled 2018-07-27 (×2): qty 1

## 2018-07-27 MED ORDER — SCOPOLAMINE 1 MG/3DAYS TD PT72
MEDICATED_PATCH | TRANSDERMAL | Status: DC | PRN
  Administered 2018-07-27: 1 via TRANSDERMAL

## 2018-07-27 MED ORDER — PHENYLEPHRINE 40 MCG/ML (10ML) SYRINGE FOR IV PUSH (FOR BLOOD PRESSURE SUPPORT)
PREFILLED_SYRINGE | INTRAVENOUS | Status: DC | PRN
  Administered 2018-07-27 (×2): 80 ug via INTRAVENOUS

## 2018-07-27 MED ORDER — TIZANIDINE HCL 2 MG PO TABS
4.0000 mg | ORAL_TABLET | Freq: Three times a day (TID) | ORAL | Status: DC | PRN
  Administered 2018-07-28: 4 mg via ORAL
  Filled 2018-07-27: qty 2

## 2018-07-27 MED ORDER — PREDNISONE 5 MG PO TABS
5.0000 mg | ORAL_TABLET | Freq: Every day | ORAL | Status: DC
  Administered 2018-07-27 – 2018-07-29 (×2): 5 mg via ORAL
  Filled 2018-07-27 (×2): qty 1

## 2018-07-27 MED ORDER — HYDROCHLOROTHIAZIDE 12.5 MG PO CAPS: 12.5000 mg | ORAL_CAPSULE | Freq: Every day | ORAL | Status: DC | PRN

## 2018-07-27 MED ORDER — AMLODIPINE BESYLATE 10 MG PO TABS
10.0000 mg | ORAL_TABLET | Freq: Every day | ORAL | Status: DC
  Administered 2018-07-27 – 2018-07-29 (×2): 10 mg via ORAL
  Filled 2018-07-27 (×2): qty 1

## 2018-07-27 MED ORDER — TRAMADOL HCL 50 MG PO TABS: 50.0000 mg | ORAL_TABLET | Freq: Two times a day (BID) | ORAL | Status: DC | PRN

## 2018-07-27 MED ORDER — SUGAMMADEX SODIUM 200 MG/2ML IV SOLN
INTRAVENOUS | Status: DC | PRN
  Administered 2018-07-27: 200 mg via INTRAVENOUS

## 2018-07-27 MED ORDER — DICYCLOMINE HCL 10 MG PO CAPS
10.0000 mg | ORAL_CAPSULE | Freq: Three times a day (TID) | ORAL | Status: DC | PRN
  Administered 2018-07-29: 10 mg via ORAL
  Filled 2018-07-27: qty 1

## 2018-07-27 MED ORDER — LIDOCAINE 2% (20 MG/ML) 5 ML SYRINGE
INTRAMUSCULAR | Status: DC | PRN
  Administered 2018-07-27: 100 mg via INTRAVENOUS

## 2018-07-27 MED ORDER — GLUCAGON HCL RDNA (DIAGNOSTIC) 1 MG IJ SOLR
INTRAMUSCULAR | Status: AC
  Filled 2018-07-27: qty 1

## 2018-07-27 MED ORDER — FOLIC ACID 1 MG PO TABS
1.0000 mg | ORAL_TABLET | Freq: Every day | ORAL | Status: DC
  Administered 2018-07-27 – 2018-07-29 (×2): 1 mg via ORAL
  Filled 2018-07-27 (×2): qty 1

## 2018-07-27 MED ORDER — PROPOFOL 10 MG/ML IV BOLUS
INTRAVENOUS | Status: DC | PRN
  Administered 2018-07-27: 170 mg via INTRAVENOUS

## 2018-07-27 MED ORDER — POTASSIUM CHLORIDE CRYS ER 10 MEQ PO TBCR
10.0000 meq | EXTENDED_RELEASE_TABLET | Freq: Every day | ORAL | Status: DC
  Filled 2018-07-27: qty 1

## 2018-07-27 MED ORDER — SODIUM CHLORIDE 0.9 % IV SOLN: INTRAVENOUS | Status: DC

## 2018-07-27 MED ORDER — FENTANYL CITRATE (PF) 100 MCG/2ML IJ SOLN
INTRAMUSCULAR | Status: DC | PRN
  Administered 2018-07-27 (×2): 50 ug via INTRAVENOUS

## 2018-07-27 MED ORDER — MIDAZOLAM HCL 2 MG/2ML IJ SOLN
INTRAMUSCULAR | Status: AC
  Filled 2018-07-27: qty 2

## 2018-07-27 MED ORDER — CARVEDILOL 25 MG PO TABS
25.0000 mg | ORAL_TABLET | Freq: Two times a day (BID) | ORAL | Status: DC
  Administered 2018-07-27 – 2018-07-29 (×5): 25 mg via ORAL
  Filled 2018-07-27 (×3): qty 1
  Filled 2018-07-27: qty 2
  Filled 2018-07-27: qty 1

## 2018-07-27 NOTE — Progress Notes (Signed)
To MRI per wheelchair

## 2018-07-27 NOTE — Brief Op Note (Signed)
07/25/2018 - 07/27/2018  8:21 AM  PATIENT:  Sharene Skeans  62 y.o. female  PRE-OPERATIVE DIAGNOSIS:  CBD stones  POST-OPERATIVE DIAGNOSIS:  CBD stone s/p sphincterotomy, s/p stone extraction  PROCEDURE:  Procedure(s): ENDOSCOPIC RETROGRADE CHOLANGIOPANCREATOGRAPHY (ERCP) (N/A)  SURGEON:  Surgeon(s) and Role:    Ronnette Juniper, MD - Primary  PHYSICIAN ASSISTANT:   ASSISTANTS:Lisa Eusebio Me, Elspeth Cho, Tech  ANESTHESIA:   MAC  EBL: Minimal   BLOOD ADMINISTERED:none  DRAINS: none   LOCAL MEDICATIONS USED:  NONE  SPECIMEN:  No Specimen  DISPOSITION OF SPECIMEN:  N/A  COUNTS:  YES  TOURNIQUET:  * No tourniquets in log *  DICTATION: .Dragon Dictation  PLAN OF CARE: Admit to inpatient   PATIENT DISPOSITION:  PACU - hemodynamically stable.   Delay start of Pharmacological VTE agent (>24hrs) due to surgical blood loss or risk of bleeding: no

## 2018-07-27 NOTE — Transfer of Care (Signed)
Immediate Anesthesia Transfer of Care Note  Patient: Adriana Roberts  Procedure(s) Performed: ENDOSCOPIC RETROGRADE CHOLANGIOPANCREATOGRAPHY (ERCP) (N/A )  Patient Location: PACU  Anesthesia Type:General  Level of Consciousness: awake, alert  and oriented  Airway & Oxygen Therapy: Patient Spontanous Breathing and Patient connected to nasal cannula oxygen  Post-op Assessment: Report given to RN and Post -op Vital signs reviewed and stable  Post vital signs: Reviewed and stable  Last Vitals:  Vitals Value Taken Time  BP 125/86 07/27/2018  8:32 AM  Temp    Pulse 80 07/27/2018  8:32 AM  Resp 16 07/27/2018  8:32 AM  SpO2 99 % 07/27/2018  8:32 AM  Vitals shown include unvalidated device data.  Last Pain:  Vitals:   07/27/18 0721  TempSrc: Oral  PainSc: 3          Complications: No apparent anesthesia complications

## 2018-07-27 NOTE — Anesthesia Postprocedure Evaluation (Signed)
Anesthesia Post Note  Patient: Adriana Roberts  Procedure(s) Performed: ENDOSCOPIC RETROGRADE CHOLANGIOPANCREATOGRAPHY (ERCP) (N/A )     Patient location during evaluation: PACU Anesthesia Type: General Level of consciousness: sedated Pain management: pain level controlled Vital Signs Assessment: post-procedure vital signs reviewed and stable Respiratory status: spontaneous breathing and respiratory function stable Cardiovascular status: stable Postop Assessment: no apparent nausea or vomiting Anesthetic complications: no    Last Vitals:  Vitals:   07/27/18 0847 07/27/18 0848  BP: 133/83   Pulse:  68  Resp: 18 17  Temp:  36.6 C  SpO2:  96%    Last Pain:  Vitals:   07/27/18 0832  TempSrc:   PainSc: 0-No pain                 Preslei Blakley DANIEL

## 2018-07-27 NOTE — Progress Notes (Signed)
PROGRESS NOTE    Adriana Roberts  LPF:[id number] DOB: 12/11/1955 DOA: 07/25/2018 PCP: No primary care provider on file.    Brief Narrative:  62 year old female who presented with abdominal pain.  She does have significant past medical history for hypertension, rheumatoid arthritis, GERD, depression and anxiety.  Reported 3-day history of right upper quadrant abdominal pain, associated with nausea and vomiting.  Patient was evaluated at [facility], and diagnosed with acute cholecystitis.  Her liver enzymes were elevated, alkaline phosphatase 236, AST 376, ALT 268, amylase 1799, lipase 4499.  Patient was transferred for ERCP. on her initial physical examination blood pressure 131/75, heart rate 68, respiratory rate 16, temperature 98.3, oxygen saturation 98%.  Lungs were clear to auscultation bilaterally, heart S1-S2 present and rhythmic, abdomen tender at the right upper quadrant, no lower extremity edema.  Sodium 146, potassium 3.1, chloride 110, bicarb 25, glucose 66, BUN 15, creatinine 0.88, alkaline phosphatase 191, AST 180, ALT 201, total bilirubin is 1.8, white count 6.2, hemoglobin 11.6, hematocrit 38.1, platelets 293.  Patient was admitted to the hospital with working diagnosis of acute cholecystitis/ choledocholithiasis complicated by gallstone pancreatitis..   Assessment & Plan:   Principal Problem:   Acute gallstone pancreatitis Active Problems:   Cholecystitis, acute   Benign essential HTN   Depression   Polymyositis (HCC)    1.  Acute cholecystitis, complicated by choledocholithiasis and gallstone pancreatitis. On IV antiacids, as needed IV analgesics and IV antiemetics. Antibiotic prophylaxis with IV meropenem. ERCP with complete removal of stones. Had biliary sphincterotomy performed. Will resume low fat diet and will place patient npo after midnight, consult general surgery.   2.  Hypertension. Stable systolic blood pressure 125 mmHg. Holding  carvedilol, amlodipine, hctz.   3. Rheumatoid arthritis. Patient on methotrexate and prednisone 5 mg daily. No signs of adrenal insufficiency, resume oral prednisone.  4. Hypokalemia. Continue K correction with kcl, will give 80 units divided in 2 doses, follow on renal panel in am. Renal function preserved with serum cr at 0,74.    DVT prophylaxis: enoxaparin   Code Status:  full Family Communication: no family at the bedside  Disposition Plan/ discharge barriers: pending GI and surgical interventions   Consultants:    GI   Surgery   Procedures:     Antimicrobials:   Meropenem IV    Subjective: Patient feeling better post ERCP, no chest pain or dyspnea, no nausea or vomiting.   Objective: Vitals:   07/27/18 0847 07/27/18 0848 07/27/18 0912 07/27/18 1448  BP: 133/83  (!) 149/94 (!) 125/97  Pulse:  68 71 82  Resp: 18 17 18 18   Temp:  97.8 F (36.6 C) 98.4 F (36.9 C) 98.8 F (37.1 C)  TempSrc:   Oral Oral  SpO2:  96% 99% 100%  Weight:      Height:        Intake/Output Summary (Last 24 hours) at 07/27/2018 1651 Last data filed at 07/27/2018 1430 Gross per 24 hour  Intake 3181.08 ml  Output -  Net 3181.08 ml   Filed Weights   07/27/18 0721  Weight: 100.2 kg    Examination:   General: Not in pain or dyspnea, deconditioned  Neurology: Awake and alert, non focal  E ENT: no pallor, no icterus, oral mucosa moist Cardiovascular: No JVD. S1-S2 present, rhythmic, no gallops, rubs, or murmurs. No lower extremity edema. Pulmonary: vesicular breath sounds bilaterally, adequate air movement, no wheezing, rhonchi or rales. Gastrointestinal. Abdomen mild distended with no organomegaly, non  tender, no rebound or guarding Skin. No rashes Musculoskeletal: no joint deformities     Data Reviewed: I have personally reviewed following labs and imaging studies  CBC: Recent Labs  Lab 07/26/18 0545 07/27/18 1102  WBC 6.2 10.6*  NEUTROABS  --  9.8*  HGB  11.6* 12.3  HCT 38.1 39.9  MCV 87.4 86.7  PLT 293 292   Basic Metabolic Panel: Recent Labs  Lab 07/25/18 1839 07/26/18 0545 07/27/18 1102  NA  --  146* 140  K  --  3.1* 3.3*  CL  --  110 103  CO2  --  25 26  GLUCOSE  --  66* 109*  BUN  --  15 5*  CREATININE 0.96 0.88 0.74  CALCIUM  --  8.8* 8.9  MG 2.4  --   --    GFR: Estimated Creatinine Clearance: 89.4 mL/min (by C-G formula based on SCr of 0.74 mg/dL). Liver Function Tests: Recent Labs  Lab 07/26/18 0545 07/27/18 1102  AST 180* 68*  ALT 201* 133*  ALKPHOS 191* 184*  BILITOT 1.8* 0.9  PROT 6.7 7.0  ALBUMIN 3.2* 3.3*   No results for input(s): LIPASE, AMYLASE in the last 168 hours. No results for input(s): AMMONIA in the last 168 hours. Coagulation Profile: No results for input(s): INR, PROTIME in the last 168 hours. Cardiac Enzymes: No results for input(s): CKTOTAL, CKMB, CKMBINDEX, TROPONINI in the last 168 hours. BNP (last 3 results) No results for input(s): PROBNP in the last 8760 hours. HbA1C: No results for input(s): HGBA1C in the last 72 hours. CBG: No results for input(s): GLUCAP in the last 168 hours. Lipid Profile: No results for input(s): CHOL, HDL, LDLCALC, TRIG, CHOLHDL, LDLDIRECT in the last 72 hours. Thyroid Function Tests: No results for input(s): TSH, T4TOTAL, FREET4, T3FREE, THYROIDAB in the last 72 hours. Anemia Panel: No results for input(s): VITAMINB12, FOLATE, FERRITIN, TIBC, IRON, RETICCTPCT in the last 72 hours.    Radiology Studies: I have reviewed all of the imaging during this hospital visit personally     Scheduled Meds: . amLODipine  10 mg Oral Daily  . carvedilol  25 mg Oral BID  . enoxaparin (LOVENOX) injection  40 mg Subcutaneous Q24H  . folic acid  1 mg Oral Daily  . methotrexate  17.5 mg Oral Weekly  . pantoprazole  40 mg Oral Daily  . potassium chloride  40 mEq Oral Q4H  . predniSONE  5 mg Oral Daily  . Vitamin D (Ergocalciferol)  50,000 Units Oral Weekly    Continuous Infusions: . famotidine (PEPCID) IV 20 mg (07/27/18 1525)  . lactated ringers 100 mL/hr at 07/27/18 1612  . meropenem (MERREM) IV 1 g (07/27/18 1403)     LOS: 2 days        Chimene Salo Annett Gula, MD [facility] Pager [phone]

## 2018-07-27 NOTE — Progress Notes (Signed)
Patient came back from ERCP report received from nurse. Patient continued on NPO. No distress noted.

## 2018-07-27 NOTE — Interval H&P Note (Signed)
History and Physical Interval Note: 61/female with CBD stones noted on MRCP and pancreatitis for an ERCP.  07/27/2018 7:35 AM  Adriana Roberts  has presented today for ERCP, with the diagnosis of CBD stones  The various methods of treatment have been discussed with the patient and family. After consideration of risks, benefits and other options for treatment, the patient has consented to  Procedure(s): ENDOSCOPIC RETROGRADE CHOLANGIOPANCREATOGRAPHY (ERCP) (N/A) as a surgical intervention .  The patient's history has been reviewed, patient examined, no change in status, stable for surgery.  I have reviewed the patient's chart and labs.  Questions were answered to the patient's satisfaction.     Kerin Salen

## 2018-07-27 NOTE — Anesthesia Procedure Notes (Signed)
Procedure Name: Intubation Date/Time: 07/27/2018 7:41 AM Performed by: Candis Shine, CRNA Pre-anesthesia Checklist: Patient identified, Emergency Drugs available, Suction available and Patient being monitored Patient Re-evaluated:Patient Re-evaluated prior to induction Oxygen Delivery Method: Circle System Utilized Preoxygenation: Pre-oxygenation with 100% oxygen Induction Type: IV induction Ventilation: Mask ventilation without difficulty Laryngoscope Size: Mac and 3 Grade View: Grade II Tube type: Oral Tube size: 7.0 mm Number of attempts: 1 Airway Equipment and Method: Stylet Placement Confirmation: ETT inserted through vocal cords under direct vision,  positive ETCO2 and breath sounds checked- equal and bilateral Secured at: 21 cm Tube secured with: Tape Dental Injury: Teeth and Oropharynx as per pre-operative assessment

## 2018-07-27 NOTE — Op Note (Signed)
ERCP was performed for multiple CBD stones noted on MRCP and pancreatitis.  Findings: Multiple filling defects noted in the lower and mid CBD compatible with choledocholithiasis. After biliary sphincterotomy a 12 mm balloon was used to sweep the CBD several times, starting at bifurcation. Multiple stones(at least 5) about 7 mm in size were retrieved. At conclusion, obstructive cholangiogram did not reveal any further filling defects. No stones remained in the CBD.  Recommendations: Cholecystectomy- timing to be decided as per surgical evaluation. If patient not going for surgery today, recommend low-fat diet and to remain nothing by mouth as per timing of surgery.  Kerin Salen, M.D.

## 2018-07-27 NOTE — Op Note (Signed)
[facility] Patient Name: Adriana Roberts Procedure Date : 07/27/2018 MRN: [id number] Attending MD: Kerin Salen , MD Date of Birth: August 26, 1956 CSN: [id number] Age: 62 Admit Type: Inpatient Procedure:                ERCP Indications:              Common bile duct stone(s), Acute pancreatitis Providers:                Kerin Salen, MD, Norman Clay, RN, Arlee Muslim                            Tech., Technician, Arva Chafe, CRNA Referring MD:              Medicines:                Monitored Anesthesia Care Complications:            No immediate complications. Estimated blood loss:                            Minimal. Estimated Blood Loss:     Estimated blood loss was minimal. Procedure:                Pre-Anesthesia Assessment:                           - Prior to the procedure, a History and Physical                            was performed, and patient medications and                            allergies were reviewed. The patient's tolerance of                            previous anesthesia was also reviewed. The risks                            and benefits of the procedure and the sedation                            options and risks were discussed with the patient.                            All questions were answered, and informed consent                            was obtained. Prior Anticoagulants: The patient has                            taken no previous anticoagulant or antiplatelet                            agents. ASA Grade Assessment: III - A patient with  severe systemic disease. After reviewing the risks                            and benefits, the patient was deemed in                            satisfactory condition to undergo the procedure.                           After obtaining informed consent, the scope was                            passed under direct vision. Throughout the                            procedure,  the patient's blood pressure, pulse, and                            oxygen saturations were monitored continuously. The                            [id number]-Q180V ([id number]) Olympus ERCP was introduced                            through the mouth, and used to inject contrast into                            and used to inject contrast into the bile duct. The                            ERCP was accomplished without difficulty. The                            patient tolerated the procedure well. Scope In: Scope Out: Findings:      The scout film was normal. The esophagus was successfully intubated       under direct vision. The scope was advanced to a normal major papilla in       the descending duodenum without detailed examination of the pharynx,       larynx and associated structures, and upper GI tract. The upper GI tract       was grossly normal. The bile duct was deeply cannulated with the       sphincterotome. Contrast was injected. I personally interpreted the bile       duct images. There was brisk flow of contrast through the ducts. Image       quality was excellent. Contrast extended to the entire biliary tree.       Opacification of the entire biliary tree was successful. The maximum       diameter of the ducts was 9 mm. The lower third of the main bile duct       and middle third of the main bile duct contained multiple stones, the       largest of which was 7 mm in diameter. The lower third of the main bile       duct  and middle third of the main bile duct were moderately dilated,       with stones causing an obstruction. The largest diameter was 9 mm. A       straight Roadrunner wire was passed into the biliary tree. A 10 mm       biliary sphincterotomy was made with a braided sphincterotome using ERBE       electrocautery. There was no post-sphincterotomy bleeding. The biliary       tree was swept with a 12 mm balloon starting at the bifurcation. Many       stones were removed(at  least 5). No stones remained. The wire was       advanced in to the pancreatic duct twice during attempted cannulation of       the CBD, but the pancreatic duct was not injected. The patient was given       100 mg of rectal indomethacin. Impression:               - The middle third of the main bile duct and lower                            third of the main bile duct were moderately                            dilated, with several stones causing an obstruction.                           - Multiple choledocholithiases was found. Complete                            removal was accomplished by biliary sphincterotomy                            and balloon extraction.                           - A biliary sphincterotomy was performed.                           - The biliary tree was swept.                           - Due to technical errors images were not captured. Moderate Sedation:      Patient did not receive moderate sedation for this procedure, but       instead received monitored anesthesia care. Recommendation:           - Low fat diet.                           - Refer to a surgeon for cholecystectomy. Procedure Code(s):        --- Professional ---                           [phone], Endoscopic retrograde                            cholangiopancreatography (ERCP); with removal of  calculi/debris from biliary/pancreatic duct(s)                           [phone], Endoscopic retrograde                            cholangiopancreatography (ERCP); with                            sphincterotomy/papillotomy                           [phone], Endoscopic catheterization of the biliary                            ductal system, radiological supervision and                            interpretation Diagnosis Code(s):        --- Professional ---                           K80.51, Calculus of bile duct without cholangitis                            or cholecystitis with  obstruction                           K85.90, Acute pancreatitis without necrosis or                            infection, unspecified CPT copyright 2017 [employer]. All rights reserved. The codes documented in this report are preliminary and upon coder review may  be revised to meet current compliance requirements. Kerin Salen, MD 07/27/2018 8:20:56 AM This report has been signed electronically. Number of Addenda: 0

## 2018-07-28 ENCOUNTER — Encounter (HOSPITAL_COMMUNITY): Payer: Self-pay

## 2018-07-28 ENCOUNTER — Other Ambulatory Visit: Payer: Self-pay

## 2018-07-28 ENCOUNTER — Encounter (HOSPITAL_COMMUNITY)
Admission: AD | Disposition: A | Discharge status: Home / Self Care | Payer: Self-pay | Source: Other Acute Inpatient Hospital | Attending: Internal Medicine

## 2018-07-28 ENCOUNTER — Inpatient Hospital Stay (HOSPITAL_COMMUNITY): Payer: Medicare HMO | Admitting: Anesthesiology

## 2018-07-28 HISTORY — PX: CHOLECYSTECTOMY: SHX55

## 2018-07-28 LAB — HEPATIC FUNCTION PANEL
ALK PHOS: 144 U/L — AB (ref 38–126)
ALT: 98 U/L — AB (ref 0–44)
AST: 38 U/L (ref 15–41)
Albumin: 3.1 g/dL — ABNORMAL LOW (ref 3.5–5.0)
BILIRUBIN DIRECT: 0.2 mg/dL (ref 0.0–0.2)
BILIRUBIN INDIRECT: 0.4 mg/dL (ref 0.3–0.9)
Total Bilirubin: 0.6 mg/dL (ref 0.3–1.2)
Total Protein: 6.9 g/dL (ref 6.5–8.1)

## 2018-07-28 LAB — SURGICAL PCR SCREEN
MRSA, PCR: NEGATIVE
Staphylococcus aureus: NEGATIVE

## 2018-07-28 LAB — BASIC METABOLIC PANEL
ANION GAP: 6 (ref 5–15)
BUN: 8 mg/dL (ref 6–20)
CALCIUM: 9.1 mg/dL (ref 8.9–10.3)
CO2: 25 mmol/L (ref 22–32)
Chloride: 111 mmol/L (ref 98–111)
Creatinine, Ser: 0.79 mg/dL (ref 0.44–1.00)
GFR calc Af Amer: >60 mL/min (ref >60)
GLUCOSE: 105 mg/dL — AB (ref 70–99)
Potassium: 4.1 mmol/L (ref 3.5–5.1)
Sodium: 142 mmol/L (ref 135–145)

## 2018-07-28 LAB — CBC WITH DIFFERENTIAL/PLATELET
Abs Immature Granulocytes: 0.1 10*3/uL (ref 0.0–0.1)
BASOS PCT: 0 %
Basophils Absolute: 0 10*3/uL (ref 0.0–0.1)
Eosinophils Absolute: 0 10*3/uL (ref 0.0–0.7)
Eosinophils Relative: 0 %
HCT: 38.8 % (ref 36.0–46.0)
Hemoglobin: 11.8 g/dL — ABNORMAL LOW (ref 12.0–15.0)
IMMATURE GRANULOCYTES: 1 %
Lymphocytes Relative: 16 %
Lymphs Abs: 1.4 10*3/uL (ref 0.7–4.0)
MCH: 26.5 pg (ref 26.0–34.0)
MCHC: 30.4 g/dL (ref 30.0–36.0)
MCV: 87 fL (ref 78.0–100.0)
MONOS PCT: 5 %
Monocytes Absolute: 0.5 10*3/uL (ref 0.1–1.0)
NEUTROS ABS: 6.8 10*3/uL (ref 1.7–7.7)
NEUTROS PCT: 78 %
PLATELETS: 346 10*3/uL (ref 150–400)
RBC: 4.46 MIL/uL (ref 3.87–5.11)
RDW: 16.4 % — AB (ref 11.5–15.5)
WBC: 8.7 10*3/uL (ref 4.0–10.5)

## 2018-07-28 LAB — LIPASE, BLOOD: Lipase: 77 U/L — ABNORMAL HIGH (ref 11–51)

## 2018-07-28 SURGERY — LAPAROSCOPIC CHOLECYSTECTOMY WITH INTRAOPERATIVE CHOLANGIOGRAM
Anesthesia: General | Site: Abdomen

## 2018-07-28 MED ORDER — ONDANSETRON HCL 4 MG/2ML IJ SOLN: 4.0000 mg | Freq: Four times a day (QID) | INTRAMUSCULAR | Status: DC | PRN

## 2018-07-28 MED ORDER — ONDANSETRON HCL 4 MG/2ML IJ SOLN
INTRAMUSCULAR | Status: AC
  Filled 2018-07-28: qty 2

## 2018-07-28 MED ORDER — OXYCODONE HCL 5 MG PO TABS
5.0000 mg | ORAL_TABLET | Freq: Four times a day (QID) | ORAL | Status: DC | PRN
  Administered 2018-07-28 – 2018-07-29 (×2): 5 mg via ORAL
  Filled 2018-07-28 (×2): qty 1

## 2018-07-28 MED ORDER — BUPIVACAINE-EPINEPHRINE 0.25% -1:200000 IJ SOLN
INTRAMUSCULAR | Status: DC | PRN
  Administered 2018-07-28: 30 mL

## 2018-07-28 MED ORDER — LIDOCAINE 2% (20 MG/ML) 5 ML SYRINGE
INTRAMUSCULAR | Status: DC | PRN
  Administered 2018-07-28: 100 mg via INTRAVENOUS

## 2018-07-28 MED ORDER — IOPAMIDOL (ISOVUE-300) INJECTION 61%
INTRAVENOUS | Status: AC
  Filled 2018-07-28: qty 50

## 2018-07-28 MED ORDER — FENTANYL CITRATE (PF) 100 MCG/2ML IJ SOLN
INTRAMUSCULAR | Status: AC
  Administered 2018-07-28: 50 ug via INTRAVENOUS
  Filled 2018-07-28: qty 2

## 2018-07-28 MED ORDER — DEXAMETHASONE SODIUM PHOSPHATE 10 MG/ML IJ SOLN
INTRAMUSCULAR | Status: AC
  Filled 2018-07-28: qty 1

## 2018-07-28 MED ORDER — DEXAMETHASONE SODIUM PHOSPHATE 10 MG/ML IJ SOLN
INTRAMUSCULAR | Status: DC | PRN
  Administered 2018-07-28: 10 mg via INTRAVENOUS

## 2018-07-28 MED ORDER — ROCURONIUM BROMIDE 50 MG/5ML IV SOSY
PREFILLED_SYRINGE | INTRAVENOUS | Status: AC
  Filled 2018-07-28: qty 5

## 2018-07-28 MED ORDER — MIDAZOLAM HCL 2 MG/2ML IJ SOLN
INTRAMUSCULAR | Status: AC
  Filled 2018-07-28: qty 2

## 2018-07-28 MED ORDER — FENTANYL CITRATE (PF) 100 MCG/2ML IJ SOLN
25.0000 ug | INTRAMUSCULAR | Status: DC | PRN
  Administered 2018-07-28 (×2): 50 ug via INTRAVENOUS

## 2018-07-28 MED ORDER — PHENYLEPHRINE 40 MCG/ML (10ML) SYRINGE FOR IV PUSH (FOR BLOOD PRESSURE SUPPORT)
PREFILLED_SYRINGE | INTRAVENOUS | Status: AC
  Filled 2018-07-28: qty 10

## 2018-07-28 MED ORDER — OXYCODONE HCL 5 MG PO TABS: 5.0000 mg | ORAL_TABLET | Freq: Once | ORAL | Status: DC | PRN

## 2018-07-28 MED ORDER — FENTANYL CITRATE (PF) 250 MCG/5ML IJ SOLN
INTRAMUSCULAR | Status: DC | PRN
  Administered 2018-07-28: 100 ug via INTRAVENOUS
  Administered 2018-07-28: 50 ug via INTRAVENOUS

## 2018-07-28 MED ORDER — LIDOCAINE 2% (20 MG/ML) 5 ML SYRINGE
INTRAMUSCULAR | Status: AC
  Filled 2018-07-28: qty 5

## 2018-07-28 MED ORDER — SODIUM CHLORIDE 0.9 % IR SOLN
Status: DC | PRN
  Administered 2018-07-28: 1000 mL

## 2018-07-28 MED ORDER — SUGAMMADEX SODIUM 200 MG/2ML IV SOLN
INTRAVENOUS | Status: DC | PRN
  Administered 2018-07-28: 200.4 mg via INTRAVENOUS

## 2018-07-28 MED ORDER — 0.9 % SODIUM CHLORIDE (POUR BTL) OPTIME
TOPICAL | Status: DC | PRN
  Administered 2018-07-28: 1000 mL

## 2018-07-28 MED ORDER — FENTANYL CITRATE (PF) 250 MCG/5ML IJ SOLN
INTRAMUSCULAR | Status: AC
  Filled 2018-07-28: qty 5

## 2018-07-28 MED ORDER — ROCURONIUM BROMIDE 10 MG/ML (PF) SYRINGE
PREFILLED_SYRINGE | INTRAVENOUS | Status: DC | PRN
  Administered 2018-07-28: 50 mg via INTRAVENOUS

## 2018-07-28 MED ORDER — MIDAZOLAM HCL 2 MG/2ML IJ SOLN
INTRAMUSCULAR | Status: DC | PRN
  Administered 2018-07-28: 2 mg via INTRAVENOUS

## 2018-07-28 MED ORDER — LACTATED RINGERS IV SOLN
INTRAVENOUS | Status: DC
  Administered 2018-07-28: 11:00:00 via INTRAVENOUS

## 2018-07-28 MED ORDER — OXYCODONE HCL 5 MG/5ML PO SOLN: 5.0000 mg | Freq: Once | ORAL | Status: DC | PRN

## 2018-07-28 MED ORDER — BUPIVACAINE-EPINEPHRINE (PF) 0.25% -1:200000 IJ SOLN
INTRAMUSCULAR | Status: AC
  Filled 2018-07-28: qty 30

## 2018-07-28 MED ORDER — PROPOFOL 10 MG/ML IV BOLUS
INTRAVENOUS | Status: DC | PRN
  Administered 2018-07-28: 200 mg via INTRAVENOUS

## 2018-07-28 SURGICAL SUPPLY — 52 items
APPLIER CLIP ROT 10 11.4 M/L (STAPLE) ×3
BENZOIN TINCTURE PRP APPL 2/3 (GAUZE/BANDAGES/DRESSINGS) ×3 IMPLANT
BLADE CLIPPER SURG (BLADE) IMPLANT
CANISTER SUCT 3000ML PPV (MISCELLANEOUS) ×3 IMPLANT
CHLORAPREP W/TINT 26ML (MISCELLANEOUS) ×3 IMPLANT
CLIP APPLIE ROT 10 11.4 M/L (STAPLE) ×1 IMPLANT
CLOSURE WOUND 1/2 X4 (GAUZE/BANDAGES/DRESSINGS) ×1
CLOSURE WOUND 1/4 X3 (GAUZE/BANDAGES/DRESSINGS) ×1
COVER MAYO STAND STRL (DRAPES) ×3 IMPLANT
COVER SURGICAL LIGHT HANDLE (MISCELLANEOUS) ×3 IMPLANT
DRAPE C-ARM 42X72 X-RAY (DRAPES) IMPLANT
DRSG TEGADERM 2-3/8X2-3/4 SM (GAUZE/BANDAGES/DRESSINGS) ×3 IMPLANT
DRSG TEGADERM 4X4.75 (GAUZE/BANDAGES/DRESSINGS) ×3 IMPLANT
ELECT REM PT RETURN 9FT ADLT (ELECTROSURGICAL) ×3
ELECTRODE REM PT RTRN 9FT ADLT (ELECTROSURGICAL) ×1 IMPLANT
FILTER SMOKE EVAC LAPAROSHD (FILTER) ×3 IMPLANT
GAUZE SPONGE 2X2 8PLY STRL LF (GAUZE/BANDAGES/DRESSINGS) ×1 IMPLANT
GLOVE BIO SURGEON STRL SZ7 (GLOVE) ×3 IMPLANT
GLOVE BIO SURGEON STRL SZ7.5 (GLOVE) ×3 IMPLANT
GLOVE BIOGEL PI IND STRL 6.5 (GLOVE) ×2 IMPLANT
GLOVE BIOGEL PI IND STRL 7.5 (GLOVE) ×1 IMPLANT
GLOVE BIOGEL PI IND STRL 8 (GLOVE) ×1 IMPLANT
GLOVE BIOGEL PI INDICATOR 6.5 (GLOVE) ×4
GLOVE BIOGEL PI INDICATOR 7.5 (GLOVE) ×2
GLOVE BIOGEL PI INDICATOR 8 (GLOVE) ×2
GLOVE SURG SS PI 6.5 STRL IVOR (GLOVE) ×6 IMPLANT
GOWN STRL REUS W/ TWL LRG LVL3 (GOWN DISPOSABLE) ×5 IMPLANT
GOWN STRL REUS W/TWL LRG LVL3 (GOWN DISPOSABLE) ×10
KIT BASIN OR (CUSTOM PROCEDURE TRAY) ×3 IMPLANT
KIT TURNOVER KIT B (KITS) ×3 IMPLANT
NS IRRIG 1000ML POUR BTL (IV SOLUTION) ×3 IMPLANT
PAD ARMBOARD 7.5X6 YLW CONV (MISCELLANEOUS) ×3 IMPLANT
POUCH RETRIEVAL ECOSAC 10 (ENDOMECHANICALS) IMPLANT
POUCH RETRIEVAL ECOSAC 10MM (ENDOMECHANICALS)
POUCH SPECIMEN RETRIEVAL 10MM (ENDOMECHANICALS) ×3 IMPLANT
SCISSORS LAP 5X35 DISP (ENDOMECHANICALS) ×3 IMPLANT
SET CHOLANGIOGRAPH 5 50 .035 (SET/KITS/TRAYS/PACK) IMPLANT
SET IRRIG TUBING LAPAROSCOPIC (IRRIGATION / IRRIGATOR) ×3 IMPLANT
SLEEVE ENDOPATH XCEL 5M (ENDOMECHANICALS) ×3 IMPLANT
SPECIMEN JAR SMALL (MISCELLANEOUS) ×3 IMPLANT
SPONGE GAUZE 2X2 STER 10/PKG (GAUZE/BANDAGES/DRESSINGS) ×2
STRIP CLOSURE SKIN 1/2X4 (GAUZE/BANDAGES/DRESSINGS) ×2 IMPLANT
STRIP CLOSURE SKIN 1/4X3 (GAUZE/BANDAGES/DRESSINGS) ×2 IMPLANT
SUT MNCRL AB 4-0 PS2 18 (SUTURE) ×3 IMPLANT
TOWEL OR 17X24 6PK STRL BLUE (TOWEL DISPOSABLE) ×3 IMPLANT
TOWEL OR 17X26 10 PK STRL BLUE (TOWEL DISPOSABLE) IMPLANT
TRAY LAPAROSCOPIC MC (CUSTOM PROCEDURE TRAY) ×3 IMPLANT
TROCAR XCEL BLUNT TIP 100MML (ENDOMECHANICALS) ×3 IMPLANT
TROCAR XCEL NON-BLD 11X100MML (ENDOMECHANICALS) ×3 IMPLANT
TROCAR XCEL NON-BLD 5MMX100MML (ENDOMECHANICALS) ×3 IMPLANT
TUBING INSUFFLATION (TUBING) ×3 IMPLANT
WATER STERILE IRR 1000ML POUR (IV SOLUTION) ×3 IMPLANT

## 2018-07-28 NOTE — Progress Notes (Signed)
PROGRESS NOTE    Adriana Roberts  MEQ:[id number] DOB: May 15, 1956 DOA: 07/25/2018 PCP: No primary care provider on file.    Brief Narrative:  62 year old female who presented with abdominal pain. She does have significant past medical history for hypertension, rheumatoid arthritis, GERD, depression and anxiety. Reported 3-day history of right upper quadrant abdominal pain, associated with nausea and vomiting. Patient was evaluated at [facility] diagnosed with acute cholecystitis. Her liver enzymes were elevated, alkaline phosphatase 236, AST 376, ALT 268, amylase 1799, lipase 4499.Patient was transferred for ERCP.on her initial physical examination blood pressure 131/75, heart rate 68, respiratory rate 16, temperature 98.3, oxygen saturation 98%. Lungs were clear to auscultation bilaterally, heart S1-S2 present and rhythmic, abdomen tender at the right upper quadrant, no lower extremity edema. Sodium 146, potassium 3.1, chloride 110, bicarb 25, glucose 66, BUN 15, creatinine 0.88, alkaline phosphatase 191, AST 180, ALT 201, total bilirubin is 1.8, white count 6.2, hemoglobin 11.6, hematocrit 38.1, platelets 293.  Patient was admitted to the hospital with working diagnosis of acute cholecystitis/choledocholithiasis complicated by gallstone pancreatitis..   Assessment & Plan:   Principal Problem:   Acute gallstone pancreatitis Active Problems:   Cholecystitis, acute   Benign essential HTN   Depression   Polymyositis (HCC)   1.Acute cholecystitis, complicated by choledocholithiasis and gallstone pancreatitis. SP ERCP with complete removal of stones. Had biliary sphincterotomy performed. Tolerated weill low fat diet. Continue IV analgesics and IV antiemetics. Has been NPO past midnight, surgery contacted for cholecystectomy. Will plan to discontinue antibiotic therapy post surgery.   2.Hypertension. Resumed carvedilol, amlodipine, hctz, for blood  pressure control.   3. Rheumatoid arthritis. On methotrexate and prednisone 5 mg daily. Continue current dose, no clinical signs of adrenal insufficiency.  4.Hypokalemia. K has been corrected, today at 4,1 with preserved renal function, serum cr at 0.79.    DVT prophylaxis:enoxaparin Code Status:full Family Communication:no family at the bedside Disposition Plan/ discharge barriers:pending surgical intervention   Consultants:  GI   Surgery  Procedures:    Antimicrobials:  Meropenem IV   Subjective: Patient with no abdominal pain, has been npo past midnight, no nausea or vomiting, no chest pain or dyspnea.   Objective: Vitals:   07/27/18 1448 07/27/18 1707 07/27/18 2041 07/28/18 0548  BP: (!) 125/97 123/82 118/83 119/84  Pulse: 82 81 72 69  Resp: 18 18 18 18   Temp: 98.8 F (37.1 C) 98.4 F (36.9 C) 98.7 F (37.1 C) 98.6 F (37 C)  TempSrc: Oral Oral Oral Oral  SpO2: 100% 100% 98% 98%  Weight:      Height:        Intake/Output Summary (Last 24 hours) at 07/28/2018 0957 Last data filed at 07/27/2018 1430 Gross per 24 hour  Intake 459.07 ml  Output -  Net 459.07 ml   Filed Weights   07/27/18 0721  Weight: 100.2 kg    Examination:   General: Not in pain or dyspnea Neurology: Awake and alert, non focal  E ENT: no pallor, no icterus, oral mucosa moist Cardiovascular: No JVD. S1-S2 present, rhythmic, no gallops, rubs, or murmurs. No lower extremity edema. Pulmonary: vesicular breath sounds bilaterally, adequate air movement, no wheezing, rhonchi or rales. Gastrointestinal. Abdomen with no organomegaly, non tender, no rebound or guarding Skin. No rashes Musculoskeletal: no joint deformities     Data Reviewed: I have personally reviewed following labs and imaging studies  CBC: Recent Labs  Lab 07/26/18 0545 07/27/18 1102 07/28/18 0724  WBC 6.2 10.6* 8.7  NEUTROABS  --  9.8* 6.8  HGB 11.6* 12.3 11.8*  HCT 38.1 39.9 38.8   MCV 87.4 86.7 87.0  PLT 293 292 346   Basic Metabolic Panel: Recent Labs  Lab 07/25/18 1839 07/26/18 0545 07/27/18 1102 07/28/18 0724  NA  --  146* 140 142  K  --  3.1* 3.3* 4.1  CL  --  110 103 111  CO2  --  25 26 25   GLUCOSE  --  66* 109* 105*  BUN  --  15 5* 8  CREATININE 0.96 0.88 0.74 0.79  CALCIUM  --  8.8* 8.9 9.1  MG 2.4  --   --   --    GFR: Estimated Creatinine Clearance: 89.4 mL/min (by C-G formula based on SCr of 0.79 mg/dL). Liver Function Tests: Recent Labs  Lab 07/26/18 0545 07/27/18 1102  AST 180* 68*  ALT 201* 133*  ALKPHOS 191* 184*  BILITOT 1.8* 0.9  PROT 6.7 7.0  ALBUMIN 3.2* 3.3*   No results for input(s): LIPASE, AMYLASE in the last 168 hours. No results for input(s): AMMONIA in the last 168 hours. Coagulation Profile: No results for input(s): INR, PROTIME in the last 168 hours. Cardiac Enzymes: No results for input(s): CKTOTAL, CKMB, CKMBINDEX, TROPONINI in the last 168 hours. BNP (last 3 results) No results for input(s): PROBNP in the last 8760 hours. HbA1C: No results for input(s): HGBA1C in the last 72 hours. CBG: No results for input(s): GLUCAP in the last 168 hours. Lipid Profile: No results for input(s): CHOL, HDL, LDLCALC, TRIG, CHOLHDL, LDLDIRECT in the last 72 hours. Thyroid Function Tests: No results for input(s): TSH, T4TOTAL, FREET4, T3FREE, THYROIDAB in the last 72 hours. Anemia Panel: No results for input(s): VITAMINB12, FOLATE, FERRITIN, TIBC, IRON, RETICCTPCT in the last 72 hours.    Radiology Studies: I have reviewed all of the imaging during this hospital visit personally     Scheduled Meds: . amLODipine  10 mg Oral Daily  . carvedilol  25 mg Oral BID  . enoxaparin (LOVENOX) injection  40 mg Subcutaneous Q24H  . folic acid  1 mg Oral Daily  . methotrexate  17.5 mg Oral Weekly  . pantoprazole  40 mg Oral Daily  . predniSONE  5 mg Oral Daily  . Vitamin D (Ergocalciferol)  50,000 Units Oral Weekly    Continuous Infusions: . famotidine (PEPCID) IV 20 mg (07/27/18 1525)  . lactated ringers 50 mL/hr at 07/27/18 2107  . meropenem (MERREM) IV 1 g (07/28/18 0502)     LOS: 3 days        Mauricio 07/30/18, MD [facility] Pager [phone]

## 2018-07-28 NOTE — Transfer of Care (Signed)
Immediate Anesthesia Transfer of Care Note  Patient: Adriana Roberts  Procedure(s) Performed: LAPAROSCOPIC CHOLECYSTECTOMY (N/A Abdomen)  Patient Location: PACU  Anesthesia Type:General  Level of Consciousness: awake and alert   Airway & Oxygen Therapy: Patient Spontanous Breathing and Patient connected to face mask oxygen  Post-op Assessment: Report given to RN and Post -op Vital signs reviewed and stable  Post vital signs: Reviewed and stable  Last Vitals:  Vitals Value Taken Time  BP 159/91 07/28/2018  1:16 PM  Temp    Pulse 65 07/28/2018  1:14 PM  Resp 14 07/28/2018  1:22 PM  SpO2 100 % 07/28/2018  1:14 PM  Vitals shown include unvalidated device data.  Last Pain:  Vitals:   07/28/18 0548  TempSrc: Oral  PainSc:          Complications: No apparent anesthesia complications

## 2018-07-28 NOTE — Progress Notes (Signed)
[facility] PROGRESS NOTE Subjective Patient feels fine having no symptoms today.  Going for cholecystectomy later today.  Objective: Vital signs in last 24 hours: Temp:  [97.8 F (36.6 C)-98.8 F (37.1 C)] 98.6 F (37 C) (09/30 0548) Pulse Rate:  [68-82] 69 (09/30 0548) Resp:  [17-18] 18 (09/30 0548) BP: (118-149)/(82-97) 119/84 (09/30 0548) SpO2:  [96 %-100 %] 98 % (09/30 0548) Last BM Date: 07/24/18  Intake/Output from previous day: 09/29 0701 - 09/30 0700 In: 859.1 [I.V.:859.1] Out: -  Intake/Output this shift: No intake/output data recorded.    Lab Results: Recent Labs    07/26/18 0545 07/27/18 1102 07/28/18 0724  WBC 6.2 10.6* 8.7  HGB 11.6* 12.3 11.8*  HCT 38.1 39.9 38.8  PLT 293 292 346   BMET Recent Labs    07/25/18 1839 07/26/18 0545 07/27/18 1102  NA  --  146* 140  K  --  3.1* 3.3*  CL  --  110 103  CO2  --  25 26  CREATININE 0.96 0.88 0.74   LFT Recent Labs    07/26/18 0545 07/27/18 1102  PROT 6.7 7.0  AST 180* 68*  ALT 201* 133*  ALKPHOS 191* 184*  BILITOT 1.8* 0.9   PT/INR No results for input(s): LABPROT, INR in the last 72 hours. PANCREAS No results for input(s): LIPASE in the last 72 hours.       Studies/Results: Dg Ercp Biliary & Pancreatic Ducts  Result Date: 07/27/2018 CLINICAL DATA:  Common duct stones EXAM: ERCP TECHNIQUE: Multiple spot images obtained with the fluoroscopic device and submitted for interpretation post-procedure. FLUOROSCOPY TIME:  Fluoroscopy Time:  3 minutes and 12 seconds Radiation Exposure Index (if provided by the fluoroscopic device): Number of Acquired Spot Images: 0 COMPARISON:  None. FINDINGS: Contrast fills the biliary tree. Filling defects are noted. Balloon stone retrieval is documented with patency of the duct on final imaging. IMPRESSION: See above. These images were submitted for radiologic interpretation only. Please see the procedural report for the amount of contrast and the  fluoroscopy time utilized. Electronically Signed   By: Jolaine Click M.D.   On: 07/27/2018 10:17    Medications: I have reviewed the patient's current medications.  Assessment:   1.  CBD stone.  Patient had ERCP and stone extraction yesterday.  Feels fine today.  For cholecystectomy.   Plan: We will sign off and be available if she has any further problems.  Please give [place] a call.   Tresea Mall 07/28/2018, 8:39 AM  This note was created using voice recognition software. Minor errors may Have occurred unintentionally.  Pager: [phone] If no answer or after hours call [phone]

## 2018-07-28 NOTE — Anesthesia Procedure Notes (Signed)
Procedure Name: Intubation Date/Time: 07/28/2018 12:07 PM Performed by: Valda Favia, CRNA Pre-anesthesia Checklist: Patient identified, Emergency Drugs available, Suction available and Patient being monitored Patient Re-evaluated:Patient Re-evaluated prior to induction Oxygen Delivery Method: Circle System Utilized Preoxygenation: Pre-oxygenation with 100% oxygen Induction Type: IV induction Ventilation: Mask ventilation without difficulty Laryngoscope Size: Mac and 4 Grade View: Grade III Tube type: Oral Number of attempts: 2 Airway Equipment and Method: Stylet,  Oral airway and Bougie stylet Placement Confirmation: ETT inserted through vocal cords under direct vision,  positive ETCO2 and breath sounds checked- equal and bilateral Secured at: 21 cm Tube secured with: Tape Dental Injury: Teeth and Oropharynx as per pre-operative assessment

## 2018-07-28 NOTE — Anesthesia Preprocedure Evaluation (Addendum)
Anesthesia Evaluation  Patient identified by MRN, date of birth, ID band Patient awake    Reviewed: Allergy & Precautions, NPO status , Patient's Chart, lab work & pertinent test results  History of Anesthesia Complications Negative for: history of anesthetic complications  Airway Mallampati: II  TM Distance: >3 FB Neck ROM: Full    Dental no notable dental hx. (+) Dental Advisory Given, Teeth Intact   Pulmonary neg pulmonary ROS,    Pulmonary exam normal        Cardiovascular hypertension, Pt. on home beta blockers and Pt. on medications Normal cardiovascular exam     Neuro/Psych PSYCHIATRIC DISORDERS Depression    GI/Hepatic Neg liver ROS,   Endo/Other  Morbid obesity  Renal/GU negative Renal ROS     Musculoskeletal negative musculoskeletal ROS (+)   Abdominal (+) + obese,   Peds  Hematology negative hematology ROS (+)   Anesthesia Other Findings Day of surgery medications reviewed with the patient.  Reproductive/Obstetrics                                                              Anesthesia Evaluation  Patient identified by MRN, date of birth, ID band Patient awake    Reviewed: Allergy & Precautions, NPO status , Patient's Chart, lab work & pertinent test results  History of Anesthesia Complications Negative for: history of anesthetic complications  Airway Mallampati: II  TM Distance: >3 FB Neck ROM: Full    Dental no notable dental hx. (+) Dental Advisory Given   Pulmonary neg pulmonary ROS,    Pulmonary exam normal        Cardiovascular hypertension, Pt. on home beta blockers and Pt. on medications Normal cardiovascular exam     Neuro/Psych PSYCHIATRIC DISORDERS Depression    GI/Hepatic Neg liver ROS,   Endo/Other  Morbid obesity  Renal/GU negative Renal ROS     Musculoskeletal negative musculoskeletal ROS (+)   Abdominal   Peds  Hematology negative hematology ROS (+)   Anesthesia Other Findings Day of surgery medications reviewed with the patient.  Reproductive/Obstetrics                            Anesthesia Physical Anesthesia Plan  ASA: III  Anesthesia Plan: General   Post-op Pain Management:    Induction:   PONV Risk Score and Plan: 3 and Ondansetron, Dexamethasone and Scopolamine patch - Pre-op  Airway Management Planned: Oral ETT  Additional Equipment:   Intra-op Plan:   Post-operative Plan: Extubation in OR  Informed Consent: I have reviewed the patients History and Physical, chart, labs and discussed the procedure including the risks, benefits and alternatives for the proposed anesthesia with the patient or authorized representative who has indicated his/her understanding and acceptance.   Dental advisory given  Plan Discussed with: CRNA and Anesthesiologist  Anesthesia Plan Comments:        Anesthesia Quick Evaluation  Anesthesia Physical Anesthesia Plan  ASA: III  Anesthesia Plan: General   Post-op Pain Management:    Induction: Intravenous  PONV Risk Score and Plan: 3 and Treatment may vary due to age or medical condition, Ondansetron, Dexamethasone and Scopolamine patch - Pre-op  Airway Management Planned: Oral ETT  Additional Equipment:   Intra-op Plan:  Post-operative Plan:   Informed Consent: I have reviewed the patients History and Physical, chart, labs and discussed the procedure including the risks, benefits and alternatives for the proposed anesthesia with the patient or authorized representative who has indicated his/her understanding and acceptance.   Dental advisory given  Plan Discussed with:   Anesthesia Plan Comments:         Anesthesia Quick Evaluation

## 2018-07-28 NOTE — Progress Notes (Signed)
Pt ftransfer to OR report given to Robi.

## 2018-07-28 NOTE — Progress Notes (Signed)
Pharmacy Antibiotic Note  Adriana Roberts is a 62 y.o. female admitted on 07/25/2018 with an intraabdominal infection .  Pharmacy has been consulted for meropenem dosing - day #4. Scheduled for lap chole 9/30. SCr stable 0.79.  Plan: Meropenem 1gm IV q8h Monitor for deescalation, LOT, and clinical course  Height: 5\' 6"  (167.6 cm) Weight: 221 lb (100.2 kg) IBW/kg (Calculated) : 59.3  Temp (24hrs), Avg:98.6 F (37 C), Min:98.4 F (36.9 C), Max:98.8 F (37.1 C)  Recent Labs  Lab 07/25/18 1839 07/26/18 0545 07/27/18 1102 07/28/18 0724  WBC  --  6.2 10.6* 8.7  CREATININE 0.96 0.88 0.74 0.79    Estimated Creatinine Clearance: 89.4 mL/min (by C-G formula based on SCr of 0.79 mg/dL).    No Known Allergies   07/30/18, PharmD, BCPS Clinical Pharmacist Clinical phone [phone] Please check AMION for all [facility] contact numbers 07/28/2018 9:43 AM

## 2018-07-28 NOTE — Progress Notes (Signed)
Pt for OR for lap chole with consent, NPO midnight, PCR negative, CHG bath done, alert and oriented no complain of pain at this time, removed his dentures and jewelry.

## 2018-07-28 NOTE — Consult Note (Signed)
Reason for Consult:Gallstone pancreatitis Referring Physician: Arrien  Adriana Roberts is an 62 y.o. female.  HPI: This is a 62 year old female with HTN, polymyositis, rheumatoid arthritis who was transferred from [facility] on 07/25/18 for gallstone pancreatitis.  She had a MRCP that showed multiple CBD stones, dilated CBD, and elevated lipase 4400.  Her symptoms have improved since transfer.  She underwent ERCP yesterday and her CBD was cleared of multiple stones.  Labs pending today.  She feels much better today and is quite hungry.  She has been NPO since midnight.  Past Medical History:  Diagnosis Date  . Hypertension   Polymyositis Rheumatoid arthritis  History reviewed. No pertinent surgical history.  History reviewed. No pertinent family history.  Social History:  reports that she has never smoked. She does not have any smokeless tobacco history on file. She reports that she does not drink alcohol. Her drug history is not on file.  Allergies: No Known Allergies  Medications:  Prior to Admission medications   Medication Sig Start Date End Date Taking? Authorizing Provider  amLODipine (NORVASC) 10 MG tablet Take 10 mg by mouth daily. 07/17/18  Yes [provider]  carvedilol (COREG) 25 MG tablet Take 25 mg by mouth 2 (two) times daily. 06/11/18  Yes [provider]  dicyclomine (BENTYL) 10 MG capsule Take 10 mg by mouth every 8 (eight) hours as needed for pain. 07/17/18  Yes [provider]  folic acid (FOLVITE) 1 MG tablet Take 1 mg by mouth daily. 06/26/18  Yes [provider]  hydrochlorothiazide (MICROZIDE) 12.5 MG capsule Take 12.5 mg by mouth daily as needed for fluid. 06/11/18  Yes [provider]  methotrexate 2.5 MG tablet Take 17.5 mg by mouth once a week. 06/26/18  Yes [provider]  pantoprazole (PROTONIX) 40 MG tablet Take 40 mg by mouth daily. 07/17/18  Yes [provider]  potassium chloride  (K-DUR,KLOR-CON) 10 MEQ tablet Take 10 mEq by mouth daily. 05/21/18  Yes [provider]  predniSONE (DELTASONE) 5 MG tablet Take 5 mg by mouth daily. 06/16/18  Yes [provider]  tiZANidine (ZANAFLEX) 4 MG tablet Take 4 mg by mouth 3 (three) times daily as needed for muscle spasms. 05/21/18  Yes [provider]  traMADol (ULTRAM) 50 MG tablet Take 50 mg by mouth 2 (two) times daily as needed for pain. 06/27/18  Yes [provider]  Vitamin D, Ergocalciferol, (DRISDOL) 50000 units CAPS capsule Take 50,000 Units by mouth once a week. 05/21/18  Yes [provider]     Results for orders placed or performed during the hospital encounter of 07/25/18 (from the past 48 hour(s))  Comprehensive metabolic panel     Status: Abnormal   Collection Time: 07/27/18 11:02 AM  Result Value Ref Range   Sodium 140 135 - 145 mmol/L   Potassium 3.3 (L) 3.5 - 5.1 mmol/L   Chloride 103 98 - 111 mmol/L   CO2 26 22 - 32 mmol/L   Glucose, Bld 109 (H) 70 - 99 mg/dL   BUN 5 (L) 6 - 20 mg/dL   Creatinine, Ser 0.74 0.44 - 1.00 mg/dL   Calcium 8.9 8.9 - 10.3 mg/dL   Total Protein 7.0 6.5 - 8.1 g/dL   Albumin 3.3 (L) 3.5 - 5.0 g/dL   AST 68 (H) 15 - 41 U/L   ALT 133 (H) 0 - 44 U/L   Alkaline Phosphatase 184 (H) 38 - 126 U/L   Total Bilirubin 0.9  0.3 - 1.2 mg/dL   GFR calc non Af Amer >60 >60 mL/min   GFR calc Af Amer >60 >60 mL/min    Comment: (NOTE) The eGFR has been calculated using the CKD EPI equation. This calculation has not been validated in all clinical situations. eGFR's persistently <60 mL/min signify possible Chronic Kidney Disease.    Anion gap 11 5 - 15    Comment: Performed at [facility] [address], [place], [place] [address]  CBC with Differential/Platelet     Status: Abnormal   Collection Time: 07/27/18 11:02 AM  Result Value Ref Range   WBC 10.6 (H) 4.0 - 10.5 K/uL   RBC 4.60 3.87 - 5.11 MIL/uL   Hemoglobin 12.3 12.0 - 15.0 g/dL   HCT  39.9 36.0 - 46.0 %   MCV 86.7 78.0 - 100.0 fL   MCH 26.7 26.0 - 34.0 pg   MCHC 30.8 30.0 - 36.0 g/dL   RDW 16.1 (H) 11.5 - 15.5 %   Platelets 292 150 - 400 K/uL   Neutrophils Relative % 92 %   Neutro Abs 9.8 (H) 1.7 - 7.7 K/uL   Lymphocytes Relative 7 %   Lymphs Abs 0.8 0.7 - 4.0 K/uL   Monocytes Relative 1 %   Monocytes Absolute 0.1 0.1 - 1.0 K/uL   Eosinophils Relative 0 %   Eosinophils Absolute 0.0 0.0 - 0.7 K/uL   Basophils Relative 0 %   Basophils Absolute 0.0 0.0 - 0.1 K/uL   Immature Granulocytes 0 %   Abs Immature Granulocytes 0.0 0.0 - 0.1 K/uL    Comment: Performed at [facility], [address] [place], [place] [address]  Surgical PCR screen     Status: None   Collection Time: 07/28/18  6:42 AM  Result Value Ref Range   MRSA, PCR NEGATIVE NEGATIVE   Staphylococcus aureus NEGATIVE NEGATIVE    Comment: (NOTE) The Xpert SA Assay (FDA approved for NASAL specimens in patients 52 years of age and older), is one component of a comprehensive surveillance program. It is not intended to diagnose infection nor to guide or monitor treatment. Performed at [facility], [place] [address] [place], [place] [address]   CBC with Differential/Platelet     Status: Abnormal   Collection Time: 07/28/18  7:24 AM  Result Value Ref Range   WBC 8.7 4.0 - 10.5 K/uL   RBC 4.46 3.87 - 5.11 MIL/uL   Hemoglobin 11.8 (L) 12.0 - 15.0 g/dL   HCT 38.8 36.0 - 46.0 %   MCV 87.0 78.0 - 100.0 fL   MCH 26.5 26.0 - 34.0 pg   MCHC 30.4 30.0 - 36.0 g/dL   RDW 16.4 (H) 11.5 - 15.5 %   Platelets 346 150 - 400 K/uL   Neutrophils Relative % 78 %   Neutro Abs 6.8 1.7 - 7.7 K/uL   Lymphocytes Relative 16 %   Lymphs Abs 1.4 0.7 - 4.0 K/uL   Monocytes Relative 5 %   Monocytes Absolute 0.5 0.1 - 1.0 K/uL   Eosinophils Relative 0 %   Eosinophils Absolute 0.0 0.0 - 0.7 K/uL   Basophils Relative 0 %   Basophils Absolute 0.0 0.0 - 0.1 K/uL   Immature Granulocytes 1 %   Abs Immature Granulocytes  0.1 0.0 - 0.1 K/uL    Comment: Performed at [facility] [address], [place], [place] [address]    Dg Ercp Biliary & Pancreatic Ducts  Result  Date: 07/27/2018 CLINICAL DATA:  Common duct stones EXAM: ERCP TECHNIQUE: Multiple spot images obtained with the fluoroscopic device and submitted for interpretation post-procedure. FLUOROSCOPY TIME:  Fluoroscopy Time:  3 minutes and 12 seconds Radiation Exposure Index (if provided by the fluoroscopic device): Number of Acquired Spot Images: 0 COMPARISON:  None. FINDINGS: Contrast fills the biliary tree. Filling defects are noted. Balloon stone retrieval is documented with patency of the duct on final imaging. IMPRESSION: See above. These images were submitted for radiologic interpretation only. Please see the procedural report for the amount of contrast and the fluoroscopy time utilized. Electronically Signed   By: Marybelle Killings M.D.   On: 07/27/2018 10:17    Review of Systems  Constitutional: Negative for weight loss.  HENT: Negative for ear discharge, ear pain, hearing loss and tinnitus.   Eyes: Negative for blurred vision, double vision, photophobia and pain.  Respiratory: Negative for cough, sputum production and shortness of breath.   Cardiovascular: Negative for chest pain.  Gastrointestinal: Positive for abdominal pain, diarrhea, nausea and vomiting.  Genitourinary: Negative for dysuria, flank pain, frequency and urgency.  Musculoskeletal: Negative for back pain, falls, joint pain, myalgias and neck pain.  Neurological: Negative for dizziness, tingling, sensory change, focal weakness, loss of consciousness and headaches.  Endo/Heme/Allergies: Does not bruise/bleed easily.  Psychiatric/Behavioral: Negative for depression, memory loss and substance abuse. The patient is not nervous/anxious.    Blood pressure 119/84, pulse 69, temperature 98.6 F (37 C), temperature source Oral, resp. rate 18, height '5\' 6"'  (1.676 m), weight 100.2 kg, SpO2  98 %. Physical Exam WDWN in NAD Eyes:  Pupils equal, round; sclera anicteric HENT:  Oral mucosa moist; good dentition  Neck:  No masses palpated, no thyromegaly Lungs:  CTA bilaterally; normal respiratory effort CV:  Regular rate and rhythm; no murmurs; extremities well-perfused with no edema Abd:  +bowel sounds, soft, mild distention, non-tender Skin:  Warm, dry; no sign of jaundice Psychiatric - alert and oriented x 4; calm mood and affect  Assessment/Plan: Gallstone pancreatitis s/p ERCP  Plan laparoscopic cholecystectomy today.  The surgical procedure has been discussed with the patient.  Potential risks, benefits, alternative treatments, and expected outcomes have been explained.  All of the patient's questions at this time have been answered.  The likelihood of reaching the patient's treatment goal is good.  The patient understand the proposed surgical procedure and wishes to proceed.   Imogene Burn Adriana Roberts 07/28/2018, 8:32 AM

## 2018-07-28 NOTE — Op Note (Signed)
Laparoscopic Cholecystectomy Procedure Note  Indications: This patient presents with gallstone pancreatitis which has improved and is s/p ERCP.  She will undergo laparoscopic cholecystectomy.  Pre-operative Diagnosis: Gallstone pancreatitis   Post-operative Diagnosis: same   Surgeon: Wynona Luna   Assistants: Magnus Ivan, RNFA  Anesthesia: General endotracheal anesthesia  ASA Class: 2  Procedure Details  The patient was seen again in the Holding Room. The risks, benefits, complications, treatment options, and expected outcomes were discussed with the patient. The possibilities of reaction to medication, pulmonary aspiration, perforation of viscus, bleeding, recurrent infection, finding a normal gallbladder, the need for additional procedures, failure to diagnose a condition, the possible need to convert to an open procedure, and creating a complication requiring transfusion or operation were discussed with the patient. The likelihood of improving the patient's symptoms with return to their baseline status is good.  The patient and/or family concurred with the proposed plan, giving informed consent. The site of surgery properly noted. The patient was taken to Operating Room, identified as Margaretmary Bayley and the procedure verified as Laparoscopic Cholecystectomy with Intraoperative Cholangiogram. A Time Out was held and the above information confirmed.  Prior to the induction of general anesthesia, antibiotic prophylaxis was administered. General endotracheal anesthesia was then administered and tolerated well. After the induction, the abdomen was prepped with Chloraprep and draped in sterile fashion. The patient was positioned in the supine position.  Local anesthetic agent was injected into the skin near the umbilicus and an incision made. We dissected down to the abdominal fascia with blunt dissection.  The fascia was incised vertically and we entered the peritoneal cavity bluntly.   A pursestring suture of 0-Vicryl was placed around the fascial opening.  The Hasson cannula was inserted and secured with the stay suture.  Pneumoperitoneum was then created with CO2 and tolerated well without any adverse changes in the patient's vital signs. An 11-mm port was placed in the subxiphoid position.  Two 5-mm ports were placed in the right upper quadrant. All skin incisions were infiltrated with a local anesthetic agent before making the incision and placing the trocars.   We positioned the patient in reverse Trendelenburg, tilted slightly to the patient's left.  The gallbladder was identified, the fundus grasped and retracted cephalad. The gallbladder shows minimal chronic scarring. Adhesions were lysed bluntly and with the electrocautery where indicated, taking care not to injure any adjacent organs or viscus. The infundibulum was grasped and retracted laterally, exposing the peritoneum overlying the triangle of Calot. This was then divided and exposed in a blunt fashion. The cystic duct was clearly identified and bluntly dissected circumferentially. A critical view of the cystic duct and cystic artery was obtained.  The cystic duct was then ligated with clips and divided. The cystic artery was, dissected free, ligated with clips and divided as well.  A posterior arterial branch was also controlled with a clip.  The gallbladder was dissected from the liver bed in retrograde fashion with the electrocautery. The gallbladder was removed and placed in an Endocatch sac. The liver bed was irrigated and inspected. Hemostasis was achieved with the electrocautery. Copious irrigation was utilized and was repeatedly aspirated until clear.  The gallbladder and Endocatch sac were then removed through the umbilical port site.  The pursestring suture was used to close the umbilical fascia.    We again inspected the right upper quadrant for hemostasis.  Pneumoperitoneum was released as we removed the trocars.   4-0 Monocryl was used to close the skin.  Benzoin, steri-strips, and clean dressings were applied. The patient was then extubated and brought to the recovery room in stable condition. Instrument, sponge, and needle counts were correct at closure and at the conclusion of the case.   Findings: Chronic cholecystitis with Cholelithiasis  Estimated Blood Loss: less than 50 mL         Drains: none         Specimens: Gallbladder           Complications: None; patient tolerated the procedure well.         Disposition: PACU - hemodynamically stable.         Condition: stable  Wilmon Arms. Corliss Skains, MD, [facility]  General/ Trauma Surgery Beeper [phone]  07/28/2018 12:52 PM

## 2018-07-29 ENCOUNTER — Encounter (HOSPITAL_COMMUNITY): Payer: Self-pay | Admitting: *Deleted

## 2018-07-29 DIAGNOSIS — K8050 Calculus of bile duct without cholangitis or cholecystitis without obstruction: Secondary | ICD-10-CM

## 2018-07-29 MED ORDER — POLYETHYLENE GLYCOL 3350 17 G PO PACK: 17.0000 g | PACK | Freq: Every day | ORAL | 0 refills | Status: AC

## 2018-07-29 MED ORDER — POLYETHYLENE GLYCOL 3350 17 G PO PACK: 17.0000 g | PACK | Freq: Every day | ORAL | Status: DC

## 2018-07-29 MED ORDER — OXYCODONE HCL 5 MG PO TABS: 5.0000 mg | ORAL_TABLET | Freq: Four times a day (QID) | ORAL | 0 refills | Status: AC | PRN

## 2018-07-29 NOTE — Discharge Summary (Signed)
Physician Discharge Summary  Adriana Roberts PHX:[id number] DOB: 25-Oct-1956 DOA: 07/25/2018  PCP: No primary care provider on file.  Admit date: 07/25/2018 Discharge date: 07/29/2018  Admitted From: Home  Disposition:  Home   Recommendations for Outpatient Follow-up and new medication changes:  1. Follow up with Primary Care in 7 days.  2. Continue pain control with oxycodone as needed 3. Add Miralax for bowel regimen.  4. Outpatient surgery clinic as scheduled.  Home Health: no   Equipment/Devices: no    Discharge Condition: stable  CODE STATUS: full  Diet recommendation: heart healthy   Brief/Interim Summary: 62 year old female who presented with abdominal pain. She does have significant past medical history for hypertension, rheumatoid arthritis, GERD, depression and anxiety. Reported 3-day history of right upper quadrant abdominal pain, associated with nausea and vomiting. Patient was evaluated at [facility] diagnosed with acute cholecystitis. Her liver enzymes were elevated, alkaline phosphatase was 236, AST 376, ALT 268, amylase 1799, lipase 4499.Patient was transferred to [facility] for ERCP.On her initial physical examination blood pressure 131/75, heart rate 68, respiratory rate 16, temperature 98.3, oxygen saturation 98%. Lungs were clear to auscultation bilaterally, heart S1-S2 present and rhythmic, abdomen tender at the right upper quadrant, no lower extremity edema. Sodium 146, potassium 3.1, chloride 110, bicarb 25, glucose 66, BUN 15, creatinine 0.88, alkaline phosphatase 191, AST 180, ALT 201, total bilirubin is 1.8, white count 6.2, hemoglobin 11.6, hematocrit 38.1, platelets 293.  Patient was admitted to the hospital with a working diagnosis of acute cholecystitis/choledocholithiasis complicated by gallstone pancreatitis..  1.  Acute cholecystitis, complicated by choledocholithiasis and gallstone pancreatitis.  Patient was admitted to  the medical ward, she was placed on IV fluids, IV antibiotic therapy with meropenem, as needed IV analgesics and antiemetics.  She was kept nothing by mouth and underwent ERCP.  She had complete removal of stones and had a biliary sphincterotomy performed.  Posteriorly she underwent laparoscopic cholecystectomy with no major complications.  Her diet was advanced with good toleration.   2.  Hypertension.  Blood pressure remained well controlled with carvedilol, amlodipine and hydrochlorothiazide.  3.  Polymyositis/ RA.  Patient was continued on prednisone with no signs of adrenal insufficiency, continue outpatient methotrexate.  4.  Hypokalemia.  Potassium was corrected with potassium chloride, her discharge potassium is 4.1.  Discharge Diagnoses:  Principal Problem:   Acute gallstone pancreatitis Active Problems:   Cholecystitis, acute   Benign essential HTN   Depression   Polymyositis (HCC)    Discharge Instructions   Allergies as of 07/29/2018   No Known Allergies     Medication List    STOP taking these medications   dicyclomine 10 MG capsule Commonly known as:  BENTYL   traMADol 50 MG tablet Commonly known as:  ULTRAM     TAKE these medications   amLODipine 10 MG tablet Commonly known as:  NORVASC Take 10 mg by mouth daily.   carvedilol 25 MG tablet Commonly known as:  COREG Take 25 mg by mouth 2 (two) times daily.   folic acid 1 MG tablet Commonly known as:  FOLVITE Take 1 mg by mouth daily.   hydrochlorothiazide 12.5 MG capsule Commonly known as:  MICROZIDE Take 12.5 mg by mouth daily as needed for fluid.   methotrexate 2.5 MG tablet Take 17.5 mg by mouth once a week.   oxyCODONE 5 MG immediate release tablet Commonly known as:  Oxy IR/ROXICODONE Take 1 tablet (5 mg total) by mouth every 6 (six) hours as  needed for severe pain.   pantoprazole 40 MG tablet Commonly known as:  PROTONIX Take 40 mg by mouth daily.   polyethylene glycol packet Commonly  known as:  MIRALAX / GLYCOLAX Take 17 g by mouth daily.   potassium chloride 10 MEQ tablet Commonly known as:  K-DUR,KLOR-CON Take 10 mEq by mouth daily.   predniSONE 5 MG tablet Commonly known as:  DELTASONE Take 5 mg by mouth daily.   tiZANidine 4 MG tablet Commonly known as:  ZANAFLEX Take 4 mg by mouth 3 (three) times daily as needed for muscle spasms.   Vitamin D (Ergocalciferol) 50000 units Caps capsule Commonly known as:  DRISDOL Take 50,000 Units by mouth once a week.      Follow-up Information    [facility], [place]. Go on 08/12/2018.   Specialty:  General Surgery Why:  Your appointment is 10/15 at 11:30 am Please arrive 30 minutes prior to your appointment to check in and fill out paperwork. Bring photo ID and insurance information. Contact information: [address] [place] [address] [phone]         No Known Allergies  Consultations:  Surgery    Procedures/Studies: Dg Ercp Biliary & Pancreatic Ducts  Result Date: 07/27/2018 CLINICAL DATA:  Common duct stones EXAM: ERCP TECHNIQUE: Multiple spot images obtained with the fluoroscopic device and submitted for interpretation post-procedure. FLUOROSCOPY TIME:  Fluoroscopy Time:  3 minutes and 12 seconds Radiation Exposure Index (if provided by the fluoroscopic device): Number of Acquired Spot Images: 0 COMPARISON:  None. FINDINGS: Contrast fills the biliary tree. Filling defects are noted. Balloon stone retrieval is documented with patency of the duct on final imaging. IMPRESSION: See above. These images were submitted for radiologic interpretation only. Please see the procedural report for the amount of contrast and the fluoroscopy time utilized. Electronically Signed   By: Jolaine Click M.D.   On: 07/27/2018 10:17       Subjective: Patient is feeling well, abdominal pain is well controlled, no nausea or vomiting, no dyspnea or chest pain.   Discharge  Exam: Vitals:   07/28/18 2120 07/29/18 0533  BP: (!) 143/96 (!) 150/87  Pulse: 72 (!) 58  Resp: 18 18  Temp: 98.2 F (36.8 C) 98.3 F (36.8 C)  SpO2: 100% 98%   Vitals:   07/28/18 1446 07/28/18 1901 07/28/18 2120 07/29/18 0533  BP: (!) 156/89 134/88 (!) 143/96 (!) 150/87  Pulse: 67 66 72 (!) 58  Resp: 18 16 18 18   Temp: 98.3 F (36.8 C) 97.9 F (36.6 C) 98.2 F (36.8 C) 98.3 F (36.8 C)  TempSrc: Oral Oral Oral Oral  SpO2: 97% 100% 100% 98%  Weight:      Height:        General: Not in pain or dyspnea  Neurology: Awake and alert, non focal  E ENT: no pallor, no icterus, oral mucosa moist Cardiovascular: No JVD. S1-S2 present, rhythmic, no gallops, rubs, or murmurs. No lower extremity edema. Pulmonary: vesicular breath sounds bilaterally, adequate air movement, no wheezing, rhonchi or rales. Gastrointestinal. Abdomen with no organomegaly, non tender, no rebound or guarding Skin. No rashes Musculoskeletal: no joint deformities   The results of significant diagnostics from this hospitalization (including imaging, microbiology, ancillary and laboratory) are listed below for reference.     Microbiology: Recent Results (from the past 240 hour(s))  Surgical PCR screen     Status: None   Collection Time: 07/28/18  6:42 AM  Result Value  Ref Range Status   MRSA, PCR NEGATIVE NEGATIVE Final   Staphylococcus aureus NEGATIVE NEGATIVE Final    Comment: (NOTE) The Xpert SA Assay (FDA approved for NASAL specimens in patients 27 years of age and older), is one component of a comprehensive surveillance program. It is not intended to diagnose infection nor to guide or monitor treatment. Performed at [facility], [address] [place], [place] [address]      Labs: BNP (last 3 results) No results for input(s): BNP in the last 8760 hours. Basic Metabolic Panel: Recent Labs  Lab 07/25/18 1839 07/26/18 0545 07/27/18 1102 07/28/18 0724  NA  --  146* 140 142  K  --   3.1* 3.3* 4.1  CL  --  110 103 111  CO2  --  25 26 25   GLUCOSE  --  66* 109* 105*  BUN  --  15 5* 8  CREATININE 0.96 0.88 0.74 0.79  CALCIUM  --  8.8* 8.9 9.1  MG 2.4  --   --   --    Liver Function Tests: Recent Labs  Lab 07/26/18 0545 07/27/18 1102 07/28/18 0724  AST 180* 68* 38  ALT 201* 133* 98*  ALKPHOS 191* 184* 144*  BILITOT 1.8* 0.9 0.6  PROT 6.7 7.0 6.9  ALBUMIN 3.2* 3.3* 3.1*   Recent Labs  Lab 07/28/18 0724  LIPASE 77*   No results for input(s): AMMONIA in the last 168 hours. CBC: Recent Labs  Lab 07/26/18 0545 07/27/18 1102 07/28/18 0724  WBC 6.2 10.6* 8.7  NEUTROABS  --  9.8* 6.8  HGB 11.6* 12.3 11.8*  HCT 38.1 39.9 38.8  MCV 87.4 86.7 87.0  PLT 293 292 346   Cardiac Enzymes: No results for input(s): CKTOTAL, CKMB, CKMBINDEX, TROPONINI in the last 168 hours. BNP: Invalid input(s): POCBNP CBG: No results for input(s): GLUCAP in the last 168 hours. D-Dimer No results for input(s): DDIMER in the last 72 hours. Hgb A1c No results for input(s): HGBA1C in the last 72 hours. Lipid Profile No results for input(s): CHOL, HDL, LDLCALC, TRIG, CHOLHDL, LDLDIRECT in the last 72 hours. Thyroid function studies No results for input(s): TSH, T4TOTAL, T3FREE, THYROIDAB in the last 72 hours.  Invalid input(s): FREET3 Anemia work up No results for input(s): VITAMINB12, FOLATE, FERRITIN, TIBC, IRON, RETICCTPCT in the last 72 hours. Urinalysis No results found for: COLORURINE, APPEARANCEUR, LABSPEC, PHURINE, GLUCOSEU, HGBUR, BILIRUBINUR, KETONESUR, PROTEINUR, UROBILINOGEN, NITRITE, LEUKOCYTESUR Sepsis Labs Invalid input(s): PROCALCITONIN,  WBC,  LACTICIDVEN Microbiology Recent Results (from the past 240 hour(s))  Surgical PCR screen     Status: None   Collection Time: 07/28/18  6:42 AM  Result Value Ref Range Status   MRSA, PCR NEGATIVE NEGATIVE Final   Staphylococcus aureus NEGATIVE NEGATIVE Final    Comment: (NOTE) The Xpert SA Assay (FDA approved for  NASAL specimens in patients 75 years of age and older), is one component of a comprehensive surveillance program. It is not intended to diagnose infection nor to guide or monitor treatment. Performed at [facility], [address], [place], [place]      Time coordinating discharge: 45 minutes  SIGNED:   [id number], MD  [facility] 07/29/2018, 11:50 AM Pager [phone]  If 7PM-7AM, please contact night-coverage [email or web address] Password [email or web address]

## 2018-07-29 NOTE — Progress Notes (Signed)
Pt for discharge going home, her husband at the bedside, discontinued peripheral IV line, wound site dry and intact, pt alert and oriented, ambulatory, health teachings given, next appointment, due med explained and understood, given all her personal belongings, no complain of pain at this time.

## 2018-07-29 NOTE — Progress Notes (Signed)
  [facility] Progress Note  1 Day Post-Op  Subjective: CC-  Eating breakfast. States that she is sore, but overall feeling well. Periumbilical incision is the most sore. Denies n/v. Passing a little flatus.  Objective: Vital signs in last 24 hours: Temp:  [97.2 F (36.2 C)-98.3 F (36.8 C)] 98.3 F (36.8 C) (10/01 0533) Pulse Rate:  [58-72] 58 (10/01 0533) Resp:  [12-21] 18 (10/01 0533) BP: (133-159)/(76-96) 150/87 (10/01 0533) SpO2:  [92 %-100 %] 98 % (10/01 0533) Last BM Date: 07/24/18  Intake/Output from previous day: 09/30 0701 - 10/01 0700 In: 3282.1 [P.O.:780; I.V.:2201.8; IV Piggyback:300.3] Out: 410 [Urine:400; Blood:10] Intake/Output this shift: No intake/output data recorded.  PE: Gen:  Alert, NAD, pleasant Pulm:  effort normal Abd: Soft, ND, appropriately tender, +BS, lap incisions with cdi dressings in place/umbilical dressing with some blood Psych: A&Ox3  Skin: no rashes noted, warm and dry  Lab Results:  Recent Labs    07/27/18 1102 07/28/18 0724  WBC 10.6* 8.7  HGB 12.3 11.8*  HCT 39.9 38.8  PLT 292 346   BMET Recent Labs    07/27/18 1102 07/28/18 0724  NA 140 142  K 3.3* 4.1  CL 103 111  CO2 26 25  GLUCOSE 109* 105*  BUN 5* 8  CREATININE 0.74 0.79  CALCIUM 8.9 9.1   PT/INR No results for input(s): LABPROT, INR in the last 72 hours. CMP     Component Value Date/Time   NA 142 07/28/2018 0724   K 4.1 07/28/2018 0724   CL 111 07/28/2018 0724   CO2 25 07/28/2018 0724   GLUCOSE 105 (H) 07/28/2018 0724   BUN 8 07/28/2018 0724   CREATININE 0.79 07/28/2018 0724   CALCIUM 9.1 07/28/2018 0724   PROT 6.9 07/28/2018 0724   ALBUMIN 3.1 (L) 07/28/2018 0724   AST 38 07/28/2018 0724   ALT 98 (H) 07/28/2018 0724   ALKPHOS 144 (H) 07/28/2018 0724   BILITOT 0.6 07/28/2018 0724   GFRNONAA >60 07/28/2018 0724   GFRAA >60 07/28/2018 0724   Lipase     Component Value Date/Time   LIPASE 77 (H) 07/28/2018 0724        Studies/Results: No results found.  Anti-infectives: Anti-infectives (From admission, onward)   Start     Dose/Rate Route Frequency Ordered Stop   07/25/18 2230  meropenem (MERREM) 1 g in sodium chloride 0.9 % 100 mL IVPB     1 g 200 mL/hr over 30 Minutes Intravenous Every 8 hours 07/25/18 2223         Assessment/Plan Gallstone pancreatitis s/p ERCP 9/29 S/p laparoscopic cholecystectomy 9/30 Dr. Corliss Skains - POD 1 - pain controlled, tolerating diet  ID - merrem 9/27>> FEN - HH diet VTE - SCDs, lovenox Foley - none Follow up - [facility]  Plan - Patient is stable for discharge from surgical standpoint. She will f/u in our office in about 2 weeks. Discharge instruction and f/u info on AVS. rx for oxycodone on chart. She does not need any more antibiotics from surgical standpoint.   LOS: 4 days    Franne Forts , [facility] 07/29/2018, 9:30 AM Pager: [phone] Mon 7:00 am -11:30 AM Tues-Fri 7:00 am-4:30 pm Sat-Sun 7:00 am-11:30 am

## 2018-07-29 NOTE — Anesthesia Postprocedure Evaluation (Signed)
Anesthesia Post Note  Patient: Adriana Roberts  Procedure(s) Performed: LAPAROSCOPIC CHOLECYSTECTOMY (N/A Abdomen)     Patient location during evaluation: PACU Anesthesia Type: General Level of consciousness: awake and alert Pain management: pain level controlled Vital Signs Assessment: post-procedure vital signs reviewed and stable Respiratory status: spontaneous breathing, nonlabored ventilation, respiratory function stable and patient connected to nasal cannula oxygen Cardiovascular status: blood pressure returned to baseline and stable Postop Assessment: no apparent nausea or vomiting Anesthetic complications: no    Last Vitals:  Vitals:   07/28/18 2120 07/29/18 0533  BP: (!) 143/96 (!) 150/87  Pulse: 72 (!) 58  Resp: 18 18  Temp: 36.8 C 36.8 C  SpO2: 100% 98%    Last Pain:  Vitals:   07/29/18 0734  TempSrc:   PainSc: 0-No pain                 Miara Emminger S

## 2018-07-29 NOTE — Discharge Instructions (Signed)

## 2018-07-29 NOTE — Care Management Important Message (Signed)
Important Message  Patient Details  Name: Adriana Roberts MRN: [id number] Date of Birth: [birth year]   Medicare Important Message Given:  Yes    Adriana Roberts 07/29/2018, 1:03 PM

## 2020-02-27 IMAGING — RF DG ERCP WO/W SPHINCTEROTOMY
1 series · 5 of 5 positions shown · non-contrast
Comparison: None.

CLINICAL DATA: Common duct stones

EXAM:
ERCP
TECHNIQUE: Multiple spot images obtained with the fluoroscopic device and
submitted for interpretation post-procedure.
FLUOROSCOPY TIME:  Fluoroscopy Time:  3 minutes and 12 seconds
Radiation Exposure Index (if provided by the fluoroscopic device):
Number of Acquired Spot Images: 0

[Series 1: run · 5 of 5 slices shown]
[im 1/5]
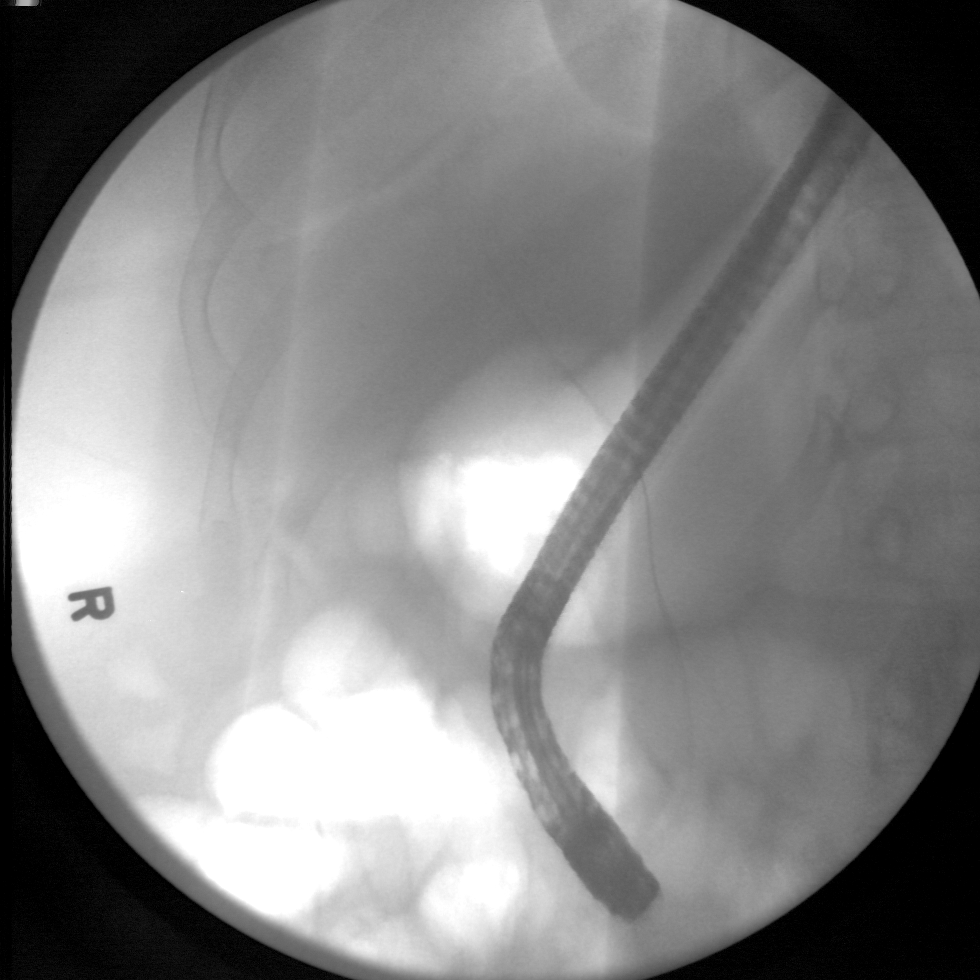
[im 2/5]
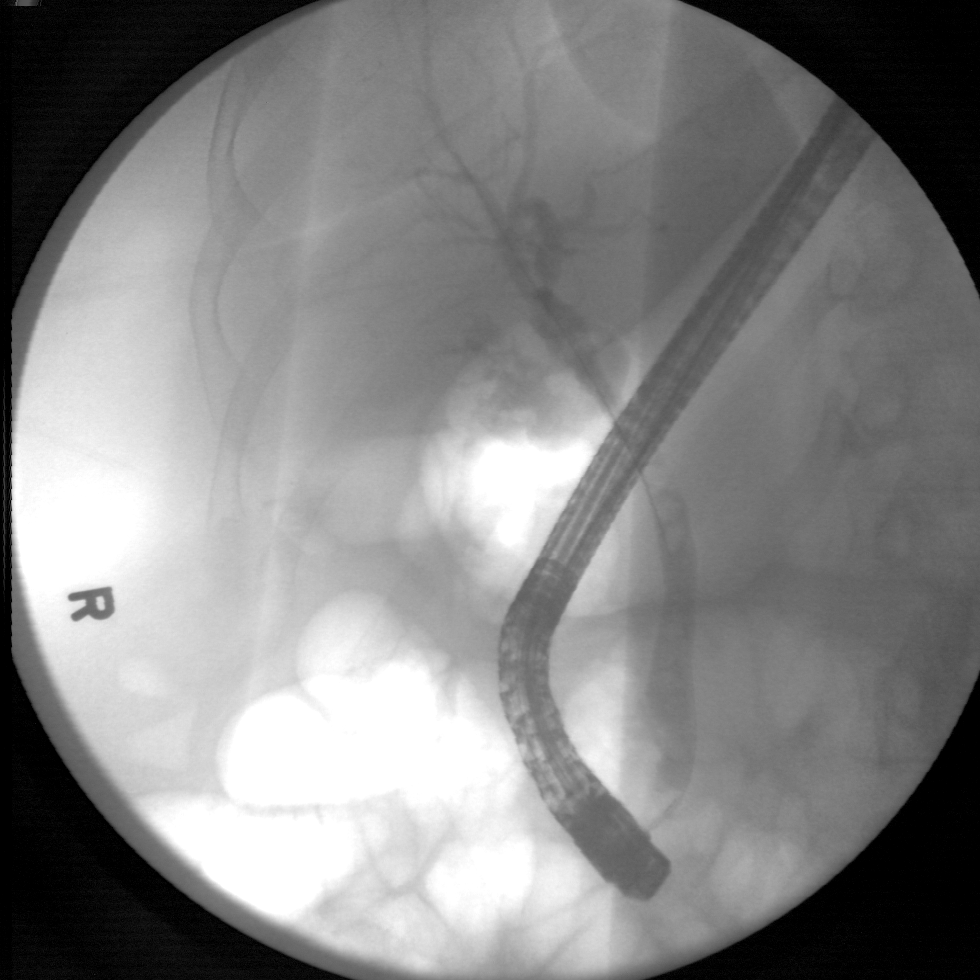
[im 3/5]
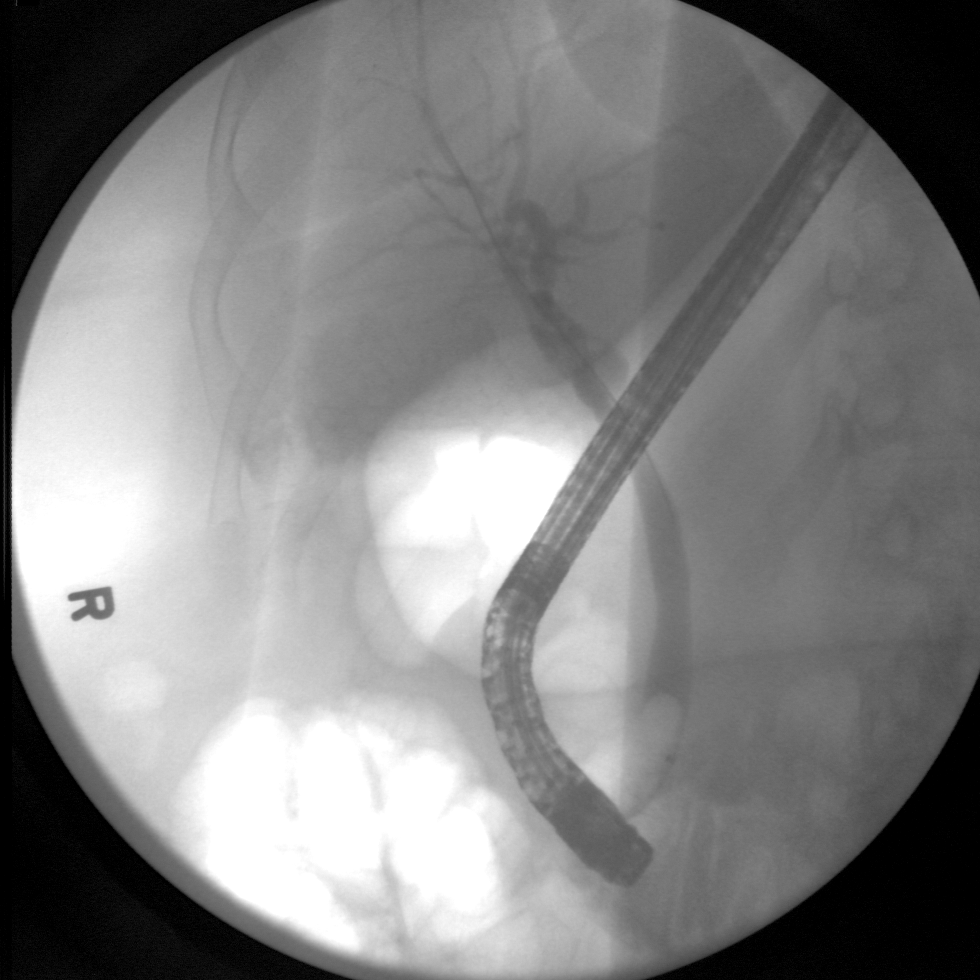
[im 4/5]
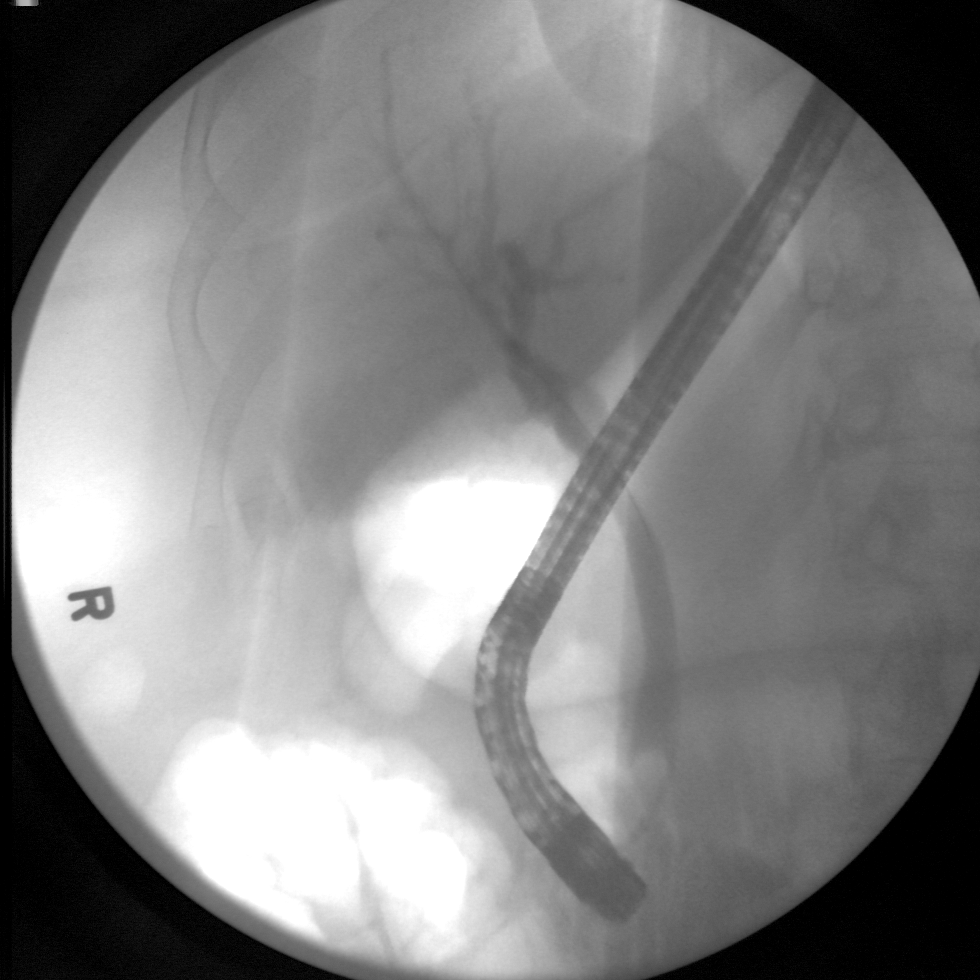
[im 5/5]
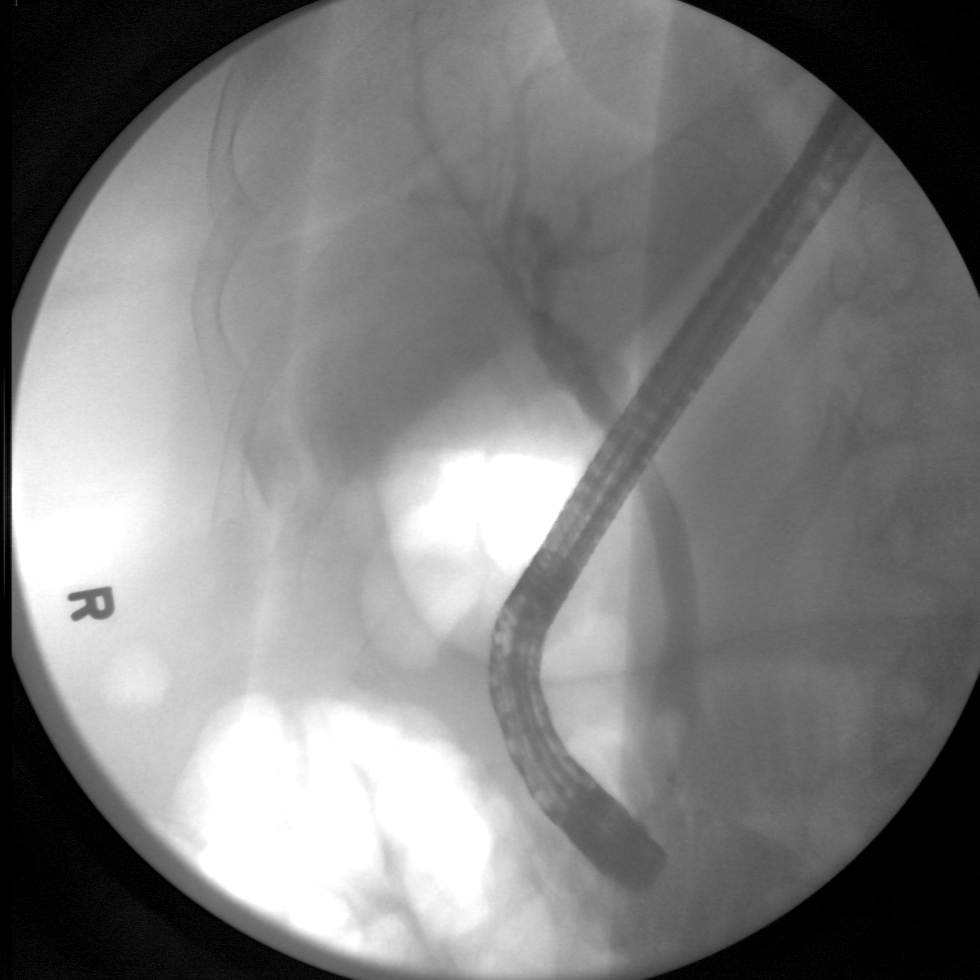

[5 of 5 positions shown; findings below may reference images not displayed]

FINDINGS: Contrast fills the biliary tree. Filling defects are noted. Balloon
stone retrieval is documented with patency of the duct on final
imaging.
IMPRESSION: See above.

These images were submitted for radiologic interpretation only.
Please see the procedural report for the amount of contrast and the
fluoroscopy time utilized.
